# Patient Record
Sex: Female | Born: 2000 | Hispanic: Yes | Smoking: Never smoker
Health system: Southern US, Community
[De-identification: ages and names within clinical notes are randomized; demographics above are authoritative.]

## PROBLEM LIST (undated history)

## (undated) ENCOUNTER — Emergency Department: Admission: EM | Payer: Self-pay | Source: Home / Self Care

## (undated) DIAGNOSIS — F32A Depression, unspecified: Secondary | ICD-10-CM

## (undated) DIAGNOSIS — G43909 Migraine, unspecified, not intractable, without status migrainosus: Secondary | ICD-10-CM

## (undated) DIAGNOSIS — F419 Anxiety disorder, unspecified: Secondary | ICD-10-CM

## (undated) HISTORY — DX: Depression, unspecified: F32.A

## (undated) HISTORY — DX: Migraine, unspecified, not intractable, without status migrainosus: G43.909

## (undated) HISTORY — DX: Anxiety disorder, unspecified: F41.9

---
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
## 2022-02-20 DIAGNOSIS — Y999 Unspecified external cause status: Secondary | ICD-10-CM | POA: Diagnosis not present

## 2022-02-20 DIAGNOSIS — R52 Pain, unspecified: Secondary | ICD-10-CM | POA: Diagnosis not present

## 2022-02-20 DIAGNOSIS — T148XXA Other injury of unspecified body region, initial encounter: Secondary | ICD-10-CM | POA: Diagnosis not present

## 2022-02-20 DIAGNOSIS — M542 Cervicalgia: Secondary | ICD-10-CM | POA: Diagnosis not present

## 2022-02-20 DIAGNOSIS — M545 Low back pain, unspecified: Secondary | ICD-10-CM | POA: Diagnosis not present

## 2022-02-20 DIAGNOSIS — M25561 Pain in right knee: Secondary | ICD-10-CM | POA: Diagnosis not present

## 2022-02-26 ENCOUNTER — Encounter: Payer: Self-pay | Admitting: Physician Assistant

## 2022-02-26 ENCOUNTER — Ambulatory Visit (INDEPENDENT_AMBULATORY_CARE_PROVIDER_SITE_OTHER): Payer: BC Managed Care – PPO

## 2022-02-26 ENCOUNTER — Ambulatory Visit (INDEPENDENT_AMBULATORY_CARE_PROVIDER_SITE_OTHER): Payer: Self-pay | Admitting: Physician Assistant

## 2022-02-26 DIAGNOSIS — M25511 Pain in right shoulder: Secondary | ICD-10-CM

## 2022-02-26 DIAGNOSIS — M898X1 Other specified disorders of bone, shoulder: Secondary | ICD-10-CM | POA: Diagnosis not present

## 2022-02-26 DIAGNOSIS — M79671 Pain in right foot: Secondary | ICD-10-CM | POA: Diagnosis not present

## 2022-02-26 MED ORDER — KETOROLAC TROMETHAMINE 60 MG/2ML IM SOLN
60.0000 mg | Freq: Once | INTRAMUSCULAR | Status: AC
  Administered 2022-02-26: 60 mg via INTRAMUSCULAR

## 2022-02-26 MED ORDER — DICLOFENAC SODIUM 75 MG PO TBEC: 75.0000 mg | DELAYED_RELEASE_TABLET | Freq: Three times a day (TID) | ORAL | 0 refills | Status: DC

## 2022-02-26 NOTE — Patient Instructions (Signed)
Proximal Biceps Tendinitis and Tenosynovitis Rehab Ask your health care provider which exercises are safe for you. Do exercises exactly as told by your health care provider and adjust them as directed. It is normal to feel mild stretching, pulling, tightness, or discomfort as you do these exercises. Stop right away if you feel sudden pain or your pain gets worse. Do not begin these exercises until told by your health care provider. Stretching and range-of-motion exercises These exercises warm up your muscles and joints and improve the movement and flexibility of your arm and shoulder. The exercises also help to relieve pain and stiffness. Forearm rotation Stand or sit with your left / right elbow bent in a 90-degree angle (right angle). Position your forearm so that the thumb is facing the ceiling (neutral position). Rotate your palm up until it cannot go any farther. Hold this position for __________ seconds. Rotate your palm down until it cannot go any farther. Hold this position for __________ seconds. Repeat __________ times. Complete this exercise __________ times a day. Elbow range of motion Stand or sit with your left / right elbow bent in a 90-degree angle (right angle). Position your forearm so that the thumb is facing the ceiling (neutral position). Slowly bend your elbow as far as you can until you feel a stretch or cannot go any farther. Hold this position for __________ seconds. Slowly straighten your elbow as far as you can until you feel a stretch or cannot go any farther. Hold this position for __________ seconds. Repeat __________ times. Complete this exercise __________ times a day. Biceps stretch Stand facing a wall, or stand by a door frame. Raise your left / right arm out to your side, to your shoulder height. Place the thumb side of your hand against the wall. Your palm should be facing the floor (palm down). Keeping your arm straight, rotate your body in the opposite  direction of the raised arm until you feel a gentle stretch in your biceps. Hold this position for __________ seconds. Slowly return to the starting position. Repeat __________ times. Complete this exercise __________ times a day. Shoulder pendulum  Stand near a table or counter that you can hold onto for balance. Bend forward at the waist and let your left / right arm hang straight down. Use your other arm to support you and help you stay balanced. Relax your left / right arm and shoulder muscles, and move your hips and your trunk so your left / right arm swings freely. Your arm should swing because of the motion of your body, not because you are using your arm or shoulder muscles. Keep moving your hips and trunk so your arm swings in the following directions, as told by your health care provider: Side to side. Forward and backward. In clockwise and counterclockwise circles. Repeat __________ times. Complete this exercise __________ times a day. Shoulder flexion, assisted  Stand facing a wall. Put your left / right palm on the wall. Slowly move your left / right hand up the wall (flexion). Stop when you feel a stretch in your shoulder, or when you reach the angle that is recommended by your health care provider. Use your other hand to help raise your arm, if needed (assisted). As your hand gets higher, you may need to step closer to the wall. Avoid shrugging or lifting your shoulder up as you raise your arm. To do this, keep your shoulder blade tucked down toward your spine. Hold this position for __________ seconds.   Slowly return to the starting position. Use your other arm to help, if needed. Repeat __________ times. Complete this exercise __________ times a day. Shoulder flexion Stand with your left / right arm hanging down at your side. Keep your arm straight as you lift your arm forward and toward the ceiling (flexion). Hold this position for __________ seconds. Slowly return to the  starting position. Repeat __________ times. Complete this exercise __________ times a day. Sleeper stretch, assisted Lie on your left / right side (injured side) with your hips and knees bent and your left / right arm straight in front of you. Bend your elbow to a 90-degree angle (right angle), so your fingers are pointing to the ceiling. Use your other hand to gently push your arm toward the floor (assisted), stopping when you feel a gentle stretch. Keep your shoulder blades lightly squeezed together during the exercise. Hold this position for __________ seconds. Slowly return to the starting position. Repeat __________ times. Complete this exercise __________ times a day. Strengthening exercises These exercises build strength and endurance in your arm and shoulder. Endurance is the ability to use your muscles for a long time, even after they get tired. Biceps curls You can use a weight or an exercise band for this exercise. Sit on a stable chair without armrests, or stand up. Hold a __________ lb / kg weight in your left / right hand, or hold an exercise band with both hands. Your palms should face up toward the ceiling at the starting position. Bend your left / right elbow and move your hand up toward your shoulder. Keep your other arm straight down, in the starting position. Hold this position for __________ seconds. Slowly return to the starting position. Repeat __________ times. Complete this exercise __________ times a day. Internal shoulder rotation You will use an exercise band secured to a stable object at waist height for this exercise. A door and doorframe work well. Stand sideways next to a door with your left / right arm closest to the door, holding the exercise band in your hand. With your elbow bent in a 90-degree angle (right angle) and keeping your elbow at your side, bring your hand toward your belly (internal rotation). Make sure your wrist is staying straight as you do  this exercise. Hold this position for __________ seconds. Slowly return to the starting position. Repeat __________ times. Complete this exercise __________ times a day. External shoulder rotation You will use an exercise band secured to a stable object at waist height for this exercise. A door and doorframe work well. Stand sideways next to a door with your left / right arm away from the door, holding the exercise band in your hand. With your elbow bent in a 90-degree angle (right angle) and keeping your elbow at your side, swing your arm away from your body (external rotation). Make sure your wrist is staying straight as you do this exercise. Hold this position for __________ seconds. Slowly return to the starting position. Repeat __________ times. Complete this exercise __________ times a day. External shoulder rotation, side-lying You will use a weight to do this exercise. Lie on your uninjured side with your left / right arm at your side. Bend your elbow to a 90-degree angle (right angle). Hold a __________ lb / kg weight in your left / right hand. Keeping your elbow at your side, raise your arm toward the ceiling (external rotation). Make sure your wrist is staying straight as you do this exercise.   Hold this position for __________ seconds. Slowly return to the starting position. Repeat __________ times. Complete this exercise __________ times a day. Scapular retraction Scapular retraction is the process of pulling the shoulder blades (scapulae) toward each other, and toward the spine. You will need an exercise band to do this exercise. Sit in a stable chair without armrests, or stand up. Secure an exercise band to a stable object in front of you so the band is at shoulder height. Hold one end of the exercise band in each hand. Squeeze your shoulder blades together and move your elbows slightly behind you (retraction). Do not shrug your shoulders upward while you do this. Hold this  position for __________ seconds. Slowly return to the starting position. Repeat __________ times. Complete this exercise __________ times a day. Scapular protraction, supine Scapular protraction is the process of moving your shoulder blades away from each other, and away from the spine, while you lie on your back (supine position). Lie on your back on a firm surface. Hold a __________ lb / kg weight in your left / right hand. Raise your left / right arm straight into the air so your hand is directly above your shoulder joint. Push the weight into the air so your shoulder (scapula) lifts off the surface that you are lying on. Think of trying to punch the ceiling by only moving your scapula forward (protraction). Do not move your head, neck, or back. Hold this position for __________ seconds. Slowly return to the starting position. Repeat __________ times. Complete this exercise __________ times a day. This information is not intended to replace advice given to you by your health care provider. Make sure you discuss any questions you have with your health care provider. Document Revised: 08/22/2020 Document Reviewed: 08/22/2020 Elsevier Patient Education  2023 [employer]  
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
## 2022-04-01 ENCOUNTER — Other Ambulatory Visit: Payer: Self-pay | Admitting: Physician Assistant

## 2022-04-01 DIAGNOSIS — M25511 Pain in right shoulder: Secondary | ICD-10-CM

## 2022-04-01 DIAGNOSIS — M79671 Pain in right foot: Secondary | ICD-10-CM

## 2022-04-01 DIAGNOSIS — M898X1 Other specified disorders of bone, shoulder: Secondary | ICD-10-CM

## 2022-05-28 ENCOUNTER — Other Ambulatory Visit: Payer: Self-pay | Admitting: Physician Assistant

## 2022-05-28 ENCOUNTER — Ambulatory Visit (INDEPENDENT_AMBULATORY_CARE_PROVIDER_SITE_OTHER): Payer: BC Managed Care – PPO | Admitting: Physician Assistant

## 2022-05-28 ENCOUNTER — Encounter: Payer: Self-pay | Admitting: Physician Assistant

## 2022-05-28 VITALS — BP 135/72 | HR 88 | Ht 65.0 in | Wt 190.0 lb

## 2022-05-28 DIAGNOSIS — Z0000 Encounter for general adult medical examination without abnormal findings: Secondary | ICD-10-CM

## 2022-05-28 DIAGNOSIS — F419 Anxiety disorder, unspecified: Secondary | ICD-10-CM | POA: Diagnosis not present

## 2022-05-28 DIAGNOSIS — Z20818 Contact with and (suspected) exposure to other bacterial communicable diseases: Secondary | ICD-10-CM

## 2022-05-28 DIAGNOSIS — M25511 Pain in right shoulder: Secondary | ICD-10-CM

## 2022-05-28 DIAGNOSIS — J029 Acute pharyngitis, unspecified: Secondary | ICD-10-CM

## 2022-05-28 DIAGNOSIS — E6609 Other obesity due to excess calories: Secondary | ICD-10-CM

## 2022-05-28 DIAGNOSIS — G8929 Other chronic pain: Secondary | ICD-10-CM

## 2022-05-28 DIAGNOSIS — F900 Attention-deficit hyperactivity disorder, predominantly inattentive type: Secondary | ICD-10-CM | POA: Diagnosis not present

## 2022-05-28 DIAGNOSIS — Z6831 Body mass index (BMI) 31.0-31.9, adult: Secondary | ICD-10-CM

## 2022-05-28 DIAGNOSIS — M25561 Pain in right knee: Secondary | ICD-10-CM

## 2022-05-28 DIAGNOSIS — F32A Depression, unspecified: Secondary | ICD-10-CM

## 2022-05-28 MED ORDER — LISDEXAMFETAMINE DIMESYLATE 40 MG PO CAPS: 40.0000 mg | ORAL_CAPSULE | ORAL | 0 refills | Status: DC

## 2022-05-28 MED ORDER — DESVENLAFAXINE SUCCINATE ER 100 MG PO TB24
100.0000 mg | ORAL_TABLET | Freq: Every day | ORAL | 1 refills | Status: DC
Start: 2022-05-28 — End: 2022-09-04

## 2022-05-28 MED ORDER — LISDEXAMFETAMINE DIMESYLATE 40 MG PO CAPS
40.0000 mg | ORAL_CAPSULE | ORAL | 0 refills | Status: DC
Start: 2022-06-26 — End: 2022-09-10

## 2022-05-28 MED ORDER — CEFDINIR 300 MG PO CAPS: 300.0000 mg | ORAL_CAPSULE | Freq: Two times a day (BID) | ORAL | 0 refills | Status: DC

## 2022-05-28 NOTE — Patient Instructions (Addendum)
Calm aid lavender tablets Get labs Make appt with Dr. Karie Schwalbe  Health Maintenance, Female Adopting a healthy lifestyle and getting preventive care are important in promoting health and wellness. Ask your health care provider about: The right schedule for you to have regular tests and exams. Things you can do on your own to prevent diseases and keep yourself healthy. What should I know about diet, weight, and exercise? Eat a healthy diet  Eat a diet that includes plenty of vegetables, fruits, low-fat dairy products, and lean protein. Do not eat a lot of foods that are high in solid fats, added sugars, or sodium. Maintain a healthy weight Body mass index (BMI) is used to identify weight problems. It estimates body fat based on height and weight. Your health care provider can help determine your BMI and help you achieve or maintain a healthy weight. Get regular exercise Get regular exercise. This is one of the most important things you can do for your health. Most adults should: Exercise for at least 150 minutes each week. The exercise should increase your heart rate and make you sweat (moderate-intensity exercise). Do strengthening exercises at least twice a week. This is in addition to the moderate-intensity exercise. Spend less time sitting. Even light physical activity can be beneficial. Watch cholesterol and blood lipids Have your blood tested for lipids and cholesterol at 19 years of age, then have this test every 5 years. Have your cholesterol levels checked more often if: Your lipid or cholesterol levels are high. You are older than 19 years of age. You are at high risk for heart disease. What should I know about cancer screening? Depending on your health history and family history, you may need to have cancer screening at various ages. This may include screening for: Breast cancer. Cervical cancer. Colorectal cancer. Skin cancer. Lung cancer. What should I know about heart disease,  diabetes, and high blood pressure? Blood pressure and heart disease High blood pressure causes heart disease and increases the risk of stroke. This is more likely to develop in people who have high blood pressure readings or are overweight. Have your blood pressure checked: Every 3-5 years if you are 29-30 years of age. Every year if you are 57 years old or older. Diabetes Have regular diabetes screenings. This checks your fasting blood sugar level. Have the screening done: Once every three years after age 60 if you are at a normal weight and have a low risk for diabetes. More often and at a younger age if you are overweight or have a high risk for diabetes. What should I know about preventing infection? Hepatitis B If you have a higher risk for hepatitis B, you should be screened for this virus. Talk with your health care provider to find out if you are at risk for hepatitis B infection. Hepatitis C Testing is recommended for: Everyone born from [birth year] through 1963. Anyone with known risk factors for hepatitis C. Sexually transmitted infections (STIs) Get screened for STIs, including gonorrhea and chlamydia, if: You are sexually active and are younger than 19 years of age. You are older than 19 years of age and your health care provider tells you that you are at risk for this type of infection. Your sexual activity has changed since you were last screened, and you are at increased risk for chlamydia or gonorrhea. Ask your health care provider if you are at risk. Ask your health care provider about whether you are at high risk for HIV. Your  health care provider may recommend a prescription medicine to help prevent HIV infection. If you choose to take medicine to prevent HIV, you should first get tested for HIV. You should then be tested every 3 months for as long as you are taking the medicine. Pregnancy If you are about to stop having your period (premenopausal) and you may become pregnant,  seek counseling before you get pregnant. Take 400 to 800 micrograms (mcg) of folic acid every day if you become pregnant. Ask for birth control (contraception) if you want to prevent pregnancy. Osteoporosis and menopause Osteoporosis is a disease in which the bones lose minerals and strength with aging. This can result in bone fractures. If you are 69 years old or older, or if you are at risk for osteoporosis and fractures, ask your health care provider if you should: Be screened for bone loss. Take a calcium or vitamin D supplement to lower your risk of fractures. Be given hormone replacement therapy (HRT) to treat symptoms of menopause. Follow these instructions at home: Alcohol use Do not drink alcohol if: Your health care provider tells you not to drink. You are pregnant, may be pregnant, or are planning to become pregnant. If you drink alcohol: Limit how much you have to: 0-1 drink a day. Know how much alcohol is in your drink. In the [place] one drink equals one 12 oz bottle of beer (355 mL), one 5 oz glass of wine (148 mL), or one 1 oz glass of hard liquor (44 mL). Lifestyle Do not use any products that contain nicotine or tobacco. These products include cigarettes, chewing tobacco, and vaping devices, such as e-cigarettes. If you need help quitting, ask your health care provider. Do not use street drugs. Do not share needles. Ask your health care provider for help if you need support or information about quitting drugs. General instructions Schedule regular health, dental, and eye exams. Stay current with your vaccines. Tell your health care provider if: You often feel depressed. You have ever been abused or do not feel safe at home. Summary Adopting a healthy lifestyle and getting preventive care are important in promoting health and wellness. Follow your health care provider's instructions about healthy diet, exercising, and getting tested or screened for diseases. Follow your  health care provider's instructions on monitoring your cholesterol and blood pressure. This information is not intended to replace advice given to you by your health care provider. Make sure you discuss any questions you have with your health care provider. Document Revised: 11/19/2020 Document Reviewed: 11/19/2020 Elsevier Patient Education  2023 [employer].
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
## 2022-05-30 DIAGNOSIS — G8929 Other chronic pain: Secondary | ICD-10-CM | POA: Insufficient documentation

## 2022-05-30 DIAGNOSIS — E6609 Other obesity due to excess calories: Secondary | ICD-10-CM | POA: Insufficient documentation

## 2022-06-02 DIAGNOSIS — Z0000 Encounter for general adult medical examination without abnormal findings: Secondary | ICD-10-CM | POA: Diagnosis not present

## 2022-06-03 LAB — COMPLETE METABOLIC PANEL WITH GFR
AG Ratio: 1.7 (calc) (ref 1.0–2.5)
ALT: 8 U/L (ref 5–32)
AST: 15 U/L (ref 12–32)
Albumin: 4.7 g/dL (ref 3.6–5.1)
Alkaline phosphatase (APISO): 50 U/L (ref 36–128)
BUN: 10 mg/dL (ref 7–20)
CO2: 29 mmol/L (ref 20–32)
Calcium: 9.7 mg/dL (ref 8.9–10.4)
Chloride: 105 mmol/L (ref 98–110)
Creat: 0.66 mg/dL (ref 0.50–0.96)
Globulin: 2.7 g/dL (calc) (ref 2.0–3.8)
Glucose, Bld: 80 mg/dL (ref 65–99)
Potassium: 5.1 mmol/L (ref 3.8–5.1)
Sodium: 140 mmol/L (ref 135–146)
Total Bilirubin: 0.6 mg/dL (ref 0.2–1.1)
Total Protein: 7.4 g/dL (ref 6.3–8.2)
eGFR: 130 mL/min/{1.73_m2} (ref >=60)

## 2022-06-03 LAB — CBC WITH DIFFERENTIAL/PLATELET
Absolute Monocytes: 289 cells/uL (ref 200–950)
Basophils Absolute: 47 cells/uL (ref 0–200)
Basophils Relative: 0.8 %
Eosinophils Absolute: 71 cells/uL (ref 15–500)
Eosinophils Relative: 1.2 %
HCT: 37 % (ref 35.0–45.0)
Hemoglobin: 12.4 g/dL (ref 11.7–15.5)
Lymphs Abs: 2590 cells/uL (ref 850–3900)
MCH: 30 pg (ref 27.0–33.0)
MCHC: 33.5 g/dL (ref 32.0–36.0)
MCV: 89.4 fL (ref 80.0–100.0)
MPV: 10.6 fL (ref 7.5–12.5)
Monocytes Relative: 4.9 %
Neutro Abs: 2903 cells/uL (ref 1500–7800)
Neutrophils Relative %: 49.2 %
Platelets: 307 10*3/uL (ref 140–400)
RBC: 4.14 10*6/uL (ref 3.80–5.10)
RDW: 12.3 % (ref 11.0–15.0)
Total Lymphocyte: 43.9 %
WBC: 5.9 10*3/uL (ref 3.8–10.8)

## 2022-06-03 LAB — TSH: TSH: 1.49 mIU/L

## 2022-06-03 LAB — LIPID PANEL W/REFLEX DIRECT LDL
Cholesterol: 147 mg/dL (ref <170)
HDL: 68 mg/dL (ref >45)
LDL Cholesterol (Calc): 66 mg/dL (calc) (ref <110)
Non-HDL Cholesterol (Calc): 79 mg/dL (calc) (ref <120)
Total CHOL/HDL Ratio: 2.2 (calc) (ref <5.0)
Triglycerides: 46 mg/dL (ref <90)

## 2022-06-03 NOTE — Progress Notes (Signed)
Carly Cooper,   Cholesterol looks good.  Thyroid looks great.  Kidney, liver, glucose look good.  Normal hemoglobin.

## 2022-06-04 ENCOUNTER — Ambulatory Visit: Payer: BC Managed Care – PPO | Admitting: Sports Medicine

## 2022-06-04 DIAGNOSIS — M25561 Pain in right knee: Secondary | ICD-10-CM

## 2022-06-04 DIAGNOSIS — G8929 Other chronic pain: Secondary | ICD-10-CM

## 2022-06-04 DIAGNOSIS — M25511 Pain in right shoulder: Secondary | ICD-10-CM

## 2022-06-04 MED ORDER — MELOXICAM 15 MG PO TABS: ORAL_TABLET | ORAL | 3 refills | Status: DC

## 2022-06-04 NOTE — Progress Notes (Signed)
    Procedures performed today:    None.  Independent interpretation of notes and tests performed by another provider:   None.  Brief History, Exam, Impression, and Recommendations:    Chronic right shoulder pain This is a very pleasant 21 year old female, she has had chronic right-sided shoulder pain localized over the deltoid as well as anteriorly over the clavicle and sternoclavicular joint for approximately 3-1/2 months now.  She has had x-rays that were unrevealing, she has done physician directed conservative treatment, and has not improved. Today I think it is reasonable to go and proceed with MRI with the field taken medially to the sternoclavicular joint. I am also going to do some additional therapy in the form of formal PT and add meloxicam, return to see me in about 6 weeks.  Chronic pain of right knee Also has had several months of pain at right knee anterior aspect, on exam she has positive painful patellar compression, other tests are normal with the exception of very weak hip abductor's, and the aggressive therapy, as she has had pain for many months we will add x-rays and an MRI. Return to see me in about 6 weeks.  We did discuss the anatomy, pathophysiology of patellofemoral syndrome and rotator cuff dysfunction syndrome    ____________________________________________ Ihor Austin. Benjamin Stain, M.D., ABFM., CAQSM., AME. Primary Care and Sports Medicine [facility]  Adjunct Professor of Family Medicine  [employer]  Restaurant manager, fast food

## 2022-06-04 NOTE — Assessment & Plan Note (Signed)
This is a very pleasant 21 year old female, she has had chronic right-sided shoulder pain localized over the deltoid as well as anteriorly over the clavicle and sternoclavicular joint for approximately 3-1/2 months now.  She has had x-rays that were unrevealing, she has done physician directed conservative treatment, and has not improved. Today I think it is reasonable to go and proceed with MRI with the field taken medially to the sternoclavicular joint. I am also going to do some additional therapy in the form of formal PT and add meloxicam, return to see me in about 6 weeks.

## 2022-06-04 NOTE — Assessment & Plan Note (Signed)
Also has had several months of pain at right knee anterior aspect, on exam she has positive painful patellar compression, other tests are normal with the exception of very weak hip abductor's, and the aggressive therapy, as she has had pain for many months we will add x-rays and an MRI. Return to see me in about 6 weeks.  We did discuss the anatomy, pathophysiology of patellofemoral syndrome and rotator cuff dysfunction syndrome

## 2022-06-09 ENCOUNTER — Ambulatory Visit (INDEPENDENT_AMBULATORY_CARE_PROVIDER_SITE_OTHER): Payer: BC Managed Care – PPO

## 2022-06-09 DIAGNOSIS — G8929 Other chronic pain: Secondary | ICD-10-CM | POA: Diagnosis not present

## 2022-06-09 DIAGNOSIS — Z09 Encounter for follow-up examination after completed treatment for conditions other than malignant neoplasm: Secondary | ICD-10-CM

## 2022-06-09 DIAGNOSIS — M25561 Pain in right knee: Secondary | ICD-10-CM | POA: Diagnosis not present

## 2022-06-09 DIAGNOSIS — M25562 Pain in left knee: Secondary | ICD-10-CM | POA: Diagnosis not present

## 2022-06-11 ENCOUNTER — Other Ambulatory Visit: Payer: Self-pay | Admitting: Neurology

## 2022-06-11 DIAGNOSIS — F900 Attention-deficit hyperactivity disorder, predominantly inattentive type: Secondary | ICD-10-CM

## 2022-06-11 MED ORDER — LISDEXAMFETAMINE DIMESYLATE 40 MG PO CAPS: 40.0000 mg | ORAL_CAPSULE | ORAL | 0 refills | Status: DC

## 2022-06-11 NOTE — Telephone Encounter (Signed)
Meds ordered this encounter  °Medications  ° lisdexamfetamine (VYVANSE) 40 MG capsule  °  Sig: Take 1 capsule (40 mg total) by mouth every morning.  °  Dispense:  30 capsule  °  Refill:  0  ° ° °

## 2022-06-11 NOTE — Telephone Encounter (Signed)
Patient called and LVM stating her pharmacy is out of stock of Vyvanse. Available at [facility] and wants [place] to send here. Pended. Sign is appropriate.

## 2022-06-17 ENCOUNTER — Encounter: Payer: Self-pay | Admitting: Family Medicine

## 2022-06-17 ENCOUNTER — Ambulatory Visit: Payer: BC Managed Care – PPO | Admitting: Family Medicine

## 2022-06-17 VITALS — BP 131/84 | HR 89 | Temp 99.2°F | Ht 65.0 in | Wt 190.0 lb

## 2022-06-17 DIAGNOSIS — H9203 Otalgia, bilateral: Secondary | ICD-10-CM

## 2022-06-17 DIAGNOSIS — R509 Fever, unspecified: Secondary | ICD-10-CM

## 2022-06-17 DIAGNOSIS — B9789 Other viral agents as the cause of diseases classified elsewhere: Secondary | ICD-10-CM

## 2022-06-17 DIAGNOSIS — Z20822 Contact with and (suspected) exposure to covid-19: Secondary | ICD-10-CM

## 2022-06-17 DIAGNOSIS — J988 Other specified respiratory disorders: Secondary | ICD-10-CM

## 2022-06-17 DIAGNOSIS — J029 Acute pharyngitis, unspecified: Secondary | ICD-10-CM

## 2022-06-17 LAB — POCT INFLUENZA A/B
Influenza A, POC: NEGATIVE
Influenza B, POC: NEGATIVE

## 2022-06-17 LAB — POCT RAPID STREP A (OFFICE): Rapid Strep A Screen: NEGATIVE

## 2022-06-17 LAB — POC COVID19 BINAXNOW: SARS Coronavirus 2 Ag: NEGATIVE

## 2022-06-17 NOTE — Assessment & Plan Note (Signed)
Point-of-care testing for flu, COVID and strep are negative today.  Recommend the continued supportive care with increase fluids, warm salt water gargles, saline nasal rinses and over-the-counter analgesics as needed.  Contact clinic if having new or worsening symptoms.

## 2022-06-17 NOTE — Progress Notes (Signed)
Carly Cooper - 21 y.o. female MRN [id number]  Date of birth: [birth year]  Subjective Chief Complaint  Patient presents with   Ear Pain   Sore Throat   URI         HPI Carly Cooper is a 21 year old female here today with complaint of fatigue, congestion, sore throat, ear pain, headache, fever and cough.  She has had symptoms for a few days.  She reports exposure to strep about 2 weeks ago and RSV last week.  She has tried over-the-counter medications with some improvement.  She denies any dyspnea, chest pain or wheezing.  Denies GI symptoms.  ROS:  A comprehensive ROS was completed and negative except as noted per HPI  Allergies  Allergen Reactions   Penicillins Rash   Amoxicillin     Past Medical History:  Diagnosis Date   Anxiety    Depression    Migraine     Past Surgical History:  Procedure Laterality Date   TONSILLECTOMY  2015   WISDOM TOOTH EXTRACTION  2020    Social History   Socioeconomic History   Marital status: Single    Spouse name: Not on file   Number of children: Not on file   Years of education: Not on file   Highest education level: Not on file  Occupational History   Not on file  Tobacco Use   Smoking status: Never   Smokeless tobacco: Never  Vaping Use   Vaping Use: Never used  Substance and Sexual Activity   Alcohol use: Never   Drug use: Never   Sexual activity: Never  Other Topics Concern   Not on file  Social History Narrative   Not on file   Social Determinants of Health   Financial Resource Strain: Not on file  Food Insecurity: Not on file  Transportation Needs: Not on file  Physical Activity: Not on file  Stress: Not on file  Social Connections: Not on file    History reviewed. No pertinent family history.  Health Maintenance  Topic Date Due   COVID-19 Vaccine (4 - 2023-24 season) 08/14/2022 (Originally 03/14/2022)   INFLUENZA VACCINE  10/12/2022 (Originally 02/11/2022)   Hepatitis C Screening  02/22/2023 (Originally  12/29/2020)   HIV Screening  05/31/2023 (Originally 12/29/2017)   DTaP/Tdap/Td (7 - Td or Tdap) 01/17/2024   HPV VACCINES  Completed     ----------------------------------------------------------------------------------------------------------------------------------------------------------------------------------------------------------------- Physical Exam BP 131/84 (BP Location: Left Arm, Patient Position: Sitting, Cuff Size: Normal)   Pulse 89   Temp 99.2 F (37.3 C) (Oral)   Ht 5\' 5"  (1.651 m)   Wt 190 lb (86.2 kg)   SpO2 100%   BMI 31.62 kg/m   Physical Exam Constitutional:      Appearance: She is well-developed.  HENT:     Head: Normocephalic and atraumatic.     Right Ear: Tympanic membrane normal.     Left Ear: Tympanic membrane normal.  Eyes:     General: No scleral icterus. Cardiovascular:     Rate and Rhythm: Normal rate and regular rhythm.  Pulmonary:     Effort: Pulmonary effort is normal.     Breath sounds: Normal breath sounds.  Musculoskeletal:     Cervical back: Neck supple.  Neurological:     Mental Status: She is alert.  Psychiatric:        Mood and Affect: Mood normal.        Behavior: Behavior normal.     ------------------------------------------------------------------------------------------------------------------------------------------------------------------------------------------------------------------- Assessment and Plan  Viral respiratory illness Point-of-care testing  for flu, COVID and strep are negative today.  Recommend the continued supportive care with increase fluids, warm salt water gargles, saline nasal rinses and over-the-counter analgesics as needed.  Contact clinic if having new or worsening symptoms.   No orders of the defined types were placed in this encounter.   No follow-ups on file.    This visit occurred during the SARS-CoV-2 public health emergency.  Safety protocols were in place, including screening  questions prior to the visit, additional usage of staff PPE, and extensive cleaning of exam room while observing appropriate contact time as indicated for disinfecting solutions.

## 2022-06-17 NOTE — Patient Instructions (Signed)

## 2022-06-18 ENCOUNTER — Ambulatory Visit: Payer: BC Managed Care – PPO | Admitting: Rehabilitative and Restorative Service Providers"

## 2022-06-18 NOTE — Therapy (Deleted)
OUTPATIENT PHYSICAL THERAPY SHOULDER EVALUATION   Patient Name: Carly Cooper MRN: [id number] DOB:01-13-2001, 21 y.o., female Today's Date: 06/18/2022  END OF SESSION:   Past Medical History:  Diagnosis Date   Anxiety    Depression    Migraine    Past Surgical History:  Procedure Laterality Date   TONSILLECTOMY  2015   WISDOM TOOTH EXTRACTION  2020   Patient Active Problem List   Diagnosis Date Noted   Viral respiratory illness 06/17/2022   Chronic right shoulder pain 05/30/2022   Chronic pain of right knee 05/30/2022   Class 1 obesity due to excess calories without serious comorbidity with body mass index (BMI) of 31.0 to 31.9 in adult 05/30/2022   Pain of right clavicle 02/26/2022   Right foot pain 02/26/2022   Trouble in sleeping 12/24/2021   Acute pain of right shoulder 09/17/2021   Poor concentration 05/20/2021   Inattention 05/20/2021   Family history of attention deficit disorder 05/20/2021   Attention deficit hyperactivity disorder (ADHD), predominantly inattentive type 05/20/2021   Migraine without aura and without status migrainosus, not intractable 07/03/2020   Anxiety and depression 07/03/2020   No energy 07/03/2020   Class 2 obesity due to excess calories without serious comorbidity with body mass index (BMI) of 37.0 to 37.9 in adult 07/03/2020   Eczema 07/03/2020   Keratosis pilaris 07/03/2020   Relationship problem with parent 07/03/2020   Iron deficiency anemia secondary to inadequate dietary iron intake 07/03/2020   Vitamin D insufficiency 07/03/2020    PCP: Tandy Gaw, PA-C  REFERRING PROVIDER: Dr Rodney Langton  REFERRING DIAG: Chronic Rt shoulder pain; Chronic Rt knee pain   THERAPY DIAG:  No diagnosis found.  Rationale for Evaluation and Treatment: Rehabilitation  ONSET DATE: ***  SUBJECTIVE:                                                                                                                                                                                       SUBJECTIVE STATEMENT: ***  PERTINENT HISTORY: ***  PAIN:  Are you having pain?  Yes:  NPRS scale: ***/10", Pain location: Pain description:  Aggravating factors:  Relieving factors:   PRECAUTIONS: None  WEIGHT BEARING RESTRICTIONS: No  FALLS:  Has patient fallen in last 6 months? No  LIVING ENVIRONMENT: Lives with: {OPRC lives with:25569::"lives with their family"} Lives in: {Lives in:25570} Stairs: {opstairs:27293} Has following equipment at home: {Assistive devices:23999}  OCCUPATION: ***  PLOF: Independent  PATIENT GOALS:***  NEXT MD VISIT:   OBJECTIVE:   DIAGNOSTIC FINDINGS:  MRI scheduled 06/22/22 Xray - 8/23 -Clavicle/shoulder - There is no evidence of fracture or dislocation. There is no evidence  of arthropathy or other focal bone abnormality. Soft tissues are unremarkable.  PATIENT SURVEYS:  FOTO ***  COGNITION: Overall cognitive status: Within functional limits for tasks assessed     SENSATION: {sensation:27233}  POSTURE: ***  UPPER EXTREMITY ROM:   Active ROM Right eval Left eval  Shoulder flexion    Shoulder extension    Shoulder abduction    Shoulder adduction    Shoulder internal rotation    Shoulder external rotation    Elbow flexion    Elbow extension    Wrist flexion    Wrist extension    Wrist ulnar deviation    Wrist radial deviation    Wrist pronation    Wrist supination    (Blank rows = not tested)  UPPER EXTREMITY MMT:  MMT Right eval Left eval  Shoulder flexion    Shoulder extension    Shoulder abduction    Shoulder adduction    Shoulder internal rotation    Shoulder external rotation    Middle trapezius    Lower trapezius    Elbow flexion    Elbow extension    Wrist flexion    Wrist extension    Wrist ulnar deviation    Wrist radial deviation    Wrist pronation    Wrist supination    Grip strength (lbs)    (Blank rows = not tested)  SHOULDER SPECIAL  TESTS: Impingement tests: {shoulder impingement test:25231:a} SLAP lesions: {SLAP lesions:25232} Instability tests: {shoulder instability test:25233} Rotator cuff assessment: {rotator cuff assessment:25234} Biceps assessment: {biceps assessment:25235}  JOINT MOBILITY TESTING:  ***  PALPATION:  ***   TODAY'S TREATMENT:                                                                                                                                         DATE: 06/18/22   PATIENT EDUCATION: Education details: POC; HEP Person educated: Patient Education method: Programmer, multimedia, Facilities manager, Actor cues, Verbal cues, and Handouts Education comprehension: verbalized understanding, returned demonstration, verbal cues required, tactile cues required, and needs further education  HOME EXERCISE PROGRAM: ***  ASSESSMENT:  CLINICAL IMPRESSION: Patient is a 21 y.o. who was seen today for physical therapy evaluation and treatment for chronic Rt shoulder and knee pain.   OBJECTIVE IMPAIRMENTS: {opptimpairments:25111}.   ACTIVITY LIMITATIONS: {activitylimitations:27494}  PARTICIPATION LIMITATIONS: {participationrestrictions:25113}  PERSONAL FACTORS: {Personal factors:25162} are also affecting patient's functional outcome.   REHAB POTENTIAL: Good  CLINICAL DECISION MAKING: Stable/uncomplicated  EVALUATION COMPLEXITY: Low   GOALS: Goals reviewed with patient? Yes  SHORT TERM GOALS: Target date: 07/16/2022   Independent in initial HEP Baseline: Goal status: INITIAL  2.  *** Baseline:  Goal status: INITIAL    LONG TERM GOALS: Target date: 08/13/2022   *** Baseline:  Goal status: INITIAL  2.  *** Baseline:  Goal status: INITIAL  3.  *** Baseline:  Goal status: INITIAL  4.  *** Baseline:  Goal  status: INITIAL  5.  *** Baseline:  Goal status: INITIAL  6.  *** Baseline:  Goal status: INITIAL  PLAN:  PT FREQUENCY: 2x/week  PT DURATION: 8 weeks  PLANNED  INTERVENTIONS: Therapeutic exercises, Therapeutic activity, Neuromuscular re-education, Patient/Family education, Self Care, Joint mobilization, Aquatic Therapy, Dry Needling, Electrical stimulation, Cryotherapy, Moist heat, Taping, Vasopneumatic device, Ultrasound, Ionotophoresis 4mg /ml Dexamethasone, Manual therapy, and Re-evaluation  PLAN FOR NEXT SESSION: review an dprogress with postural correction and HEP; manual work and/or dry needling; modalities as indicated    Fransheska Willingham , PT, MPH 06/18/2022, 7:35 AM

## 2022-06-19 NOTE — Therapy (Deleted)
OUTPATIENT PHYSICAL THERAPY SHOULDER EVALUATION   Patient Name: Carly Cooper MRN: [id number] DOB:2001-04-28, 21 y.o., female Today's Date: 06/19/2022  END OF SESSION:   Past Medical History:  Diagnosis Date   Anxiety    Depression    Migraine    Past Surgical History:  Procedure Laterality Date   TONSILLECTOMY  2015   WISDOM TOOTH EXTRACTION  2020   Patient Active Problem List   Diagnosis Date Noted   Viral respiratory illness 06/17/2022   Chronic right shoulder pain 05/30/2022   Chronic pain of right knee 05/30/2022   Class 1 obesity due to excess calories without serious comorbidity with body mass index (BMI) of 31.0 to 31.9 in adult 05/30/2022   Pain of right clavicle 02/26/2022   Right foot pain 02/26/2022   Trouble in sleeping 12/24/2021   Acute pain of right shoulder 09/17/2021   Poor concentration 05/20/2021   Inattention 05/20/2021   Family history of attention deficit disorder 05/20/2021   Attention deficit hyperactivity disorder (ADHD), predominantly inattentive type 05/20/2021   Migraine without aura and without status migrainosus, not intractable 07/03/2020   Anxiety and depression 07/03/2020   No energy 07/03/2020   Class 2 obesity due to excess calories without serious comorbidity with body mass index (BMI) of 37.0 to 37.9 in adult 07/03/2020   Eczema 07/03/2020   Keratosis pilaris 07/03/2020   Relationship problem with parent 07/03/2020   Iron deficiency anemia secondary to inadequate dietary iron intake 07/03/2020   Vitamin D insufficiency 07/03/2020    PCP: Tandy Gaw, PA-C  REFERRING PROVIDER: Dr Rodney Langton  REFERRING DIAG: Chronic Rt shoulder pain; Chronic Rt knee pain   THERAPY DIAG:  No diagnosis found.  Rationale for Evaluation and Treatment: Rehabilitation  ONSET DATE: ***  SUBJECTIVE:                                                                                                                                                                                       SUBJECTIVE STATEMENT: ***  PERTINENT HISTORY: ***  PAIN:  Are you having pain?  Yes:  NPRS scale: ***/10", Pain location: Pain description:  Aggravating factors:  Relieving factors:   PRECAUTIONS: None  WEIGHT BEARING RESTRICTIONS: No  FALLS:  Has patient fallen in last 6 months? No  LIVING ENVIRONMENT: Lives with: {[facility] lives with:25569::"lives with their family"} Lives in: {Lives in:25570} Stairs: {opstairs:27293} Has following equipment at home: {Assistive devices:23999}  OCCUPATION: ***  PLOF: Independent  PATIENT GOALS:***  NEXT MD VISIT:   OBJECTIVE:   DIAGNOSTIC FINDINGS:  MRI scheduled 06/22/22 Xray - 8/23 -Clavicle/shoulder - There is no evidence of fracture or dislocation. There is no evidence  of arthropathy or other focal bone abnormality. Soft tissues are unremarkable.  PATIENT SURVEYS:  FOTO ***  COGNITION: Overall cognitive status: Within functional limits for tasks assessed     SENSATION: {sensation:27233}  POSTURE: ***  UPPER EXTREMITY ROM:   Active ROM Right eval Left eval  Shoulder flexion    Shoulder extension    Shoulder abduction    Shoulder adduction    Shoulder internal rotation    Shoulder external rotation    Elbow flexion    Elbow extension    Wrist flexion    Wrist extension    Wrist ulnar deviation    Wrist radial deviation    Wrist pronation    Wrist supination    (Blank rows = not tested)  UPPER EXTREMITY MMT:  MMT Right eval Left eval  Shoulder flexion    Shoulder extension    Shoulder abduction    Shoulder adduction    Shoulder internal rotation    Shoulder external rotation    Middle trapezius    Lower trapezius    Elbow flexion    Elbow extension    Wrist flexion    Wrist extension    Wrist ulnar deviation    Wrist radial deviation    Wrist pronation    Wrist supination    Grip strength (lbs)    (Blank rows = not tested)  SHOULDER SPECIAL  TESTS: Impingement tests: {shoulder impingement test:25231:a} SLAP lesions: {SLAP lesions:25232} Instability tests: {shoulder instability test:25233} Rotator cuff assessment: {rotator cuff assessment:25234} Biceps assessment: {biceps assessment:25235}  JOINT MOBILITY TESTING:  ***  PALPATION:  ***   TODAY'S TREATMENT:                                                                                                                                         DATE: 06/23/22   PATIENT EDUCATION: Education details: POC; HEP Person educated: Patient Education method: Programmer, multimedia, Facilities manager, Actor cues, Verbal cues, and Handouts Education comprehension: verbalized understanding, returned demonstration, verbal cues required, tactile cues required, and needs further education  HOME EXERCISE PROGRAM: ***  ASSESSMENT:  CLINICAL IMPRESSION: Patient is a 21 y.o. who was seen today for physical therapy evaluation and treatment for chronic Rt shoulder and knee pain.   OBJECTIVE IMPAIRMENTS: {opptimpairments:25111}.   ACTIVITY LIMITATIONS: {activitylimitations:27494}  PARTICIPATION LIMITATIONS: {participationrestrictions:25113}  PERSONAL FACTORS: {Personal factors:25162} are also affecting patient's functional outcome.   REHAB POTENTIAL: Good  CLINICAL DECISION MAKING: Stable/uncomplicated  EVALUATION COMPLEXITY: Low   GOALS: Goals reviewed with patient? Yes  SHORT TERM GOALS: Target date: 07/21/2022   Independent in initial HEP Baseline: Goal status: INITIAL  2.  *** Baseline:  Goal status: INITIAL    LONG TERM GOALS: Target date: 08/18/2022   *** Baseline:  Goal status: INITIAL  2.  *** Baseline:  Goal status: INITIAL  3.  *** Baseline:  Goal status: INITIAL  4.  *** Baseline:  Goal  status: INITIAL  5.  *** Baseline:  Goal status: INITIAL  6.  *** Baseline:  Goal status: INITIAL  PLAN:  PT FREQUENCY: 2x/week  PT DURATION: 8 weeks  PLANNED  INTERVENTIONS: Therapeutic exercises, Therapeutic activity, Neuromuscular re-education, Patient/Family education, Self Care, Joint mobilization, Aquatic Therapy, Dry Needling, Electrical stimulation, Cryotherapy, Moist heat, Taping, Vasopneumatic device, Ultrasound, Ionotophoresis 4mg /ml Dexamethasone, Manual therapy, and Re-evaluation  PLAN FOR NEXT SESSION: review an dprogress with postural correction and HEP; manual work and/or dry needling; modalities as indicated    Ryan Ogborn , PT, MPH 06/19/2022, 3:23 PM

## 2022-06-22 ENCOUNTER — Ambulatory Visit (INDEPENDENT_AMBULATORY_CARE_PROVIDER_SITE_OTHER): Payer: BC Managed Care – PPO

## 2022-06-22 DIAGNOSIS — G8929 Other chronic pain: Secondary | ICD-10-CM

## 2022-06-22 DIAGNOSIS — M25561 Pain in right knee: Secondary | ICD-10-CM | POA: Diagnosis not present

## 2022-06-22 DIAGNOSIS — M25511 Pain in right shoulder: Secondary | ICD-10-CM

## 2022-06-23 ENCOUNTER — Ambulatory Visit: Payer: BC Managed Care – PPO | Admitting: Rehabilitative and Restorative Service Providers"

## 2022-06-25 ENCOUNTER — Ambulatory Visit: Payer: BC Managed Care – PPO | Attending: Sports Medicine | Admitting: Rehabilitative and Restorative Service Providers"

## 2022-06-25 ENCOUNTER — Other Ambulatory Visit: Payer: Self-pay

## 2022-06-25 DIAGNOSIS — M542 Cervicalgia: Secondary | ICD-10-CM | POA: Insufficient documentation

## 2022-06-25 DIAGNOSIS — R293 Abnormal posture: Secondary | ICD-10-CM | POA: Diagnosis not present

## 2022-06-25 DIAGNOSIS — M6281 Muscle weakness (generalized): Secondary | ICD-10-CM | POA: Insufficient documentation

## 2022-06-25 DIAGNOSIS — G8929 Other chronic pain: Secondary | ICD-10-CM | POA: Diagnosis not present
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
## 2022-06-30 ENCOUNTER — Ambulatory Visit: Payer: BC Managed Care – PPO | Admitting: Rehabilitative and Restorative Service Providers"

## 2022-06-30 ENCOUNTER — Encounter: Payer: Self-pay | Admitting: Rehabilitative and Restorative Service Providers"

## 2022-06-30 DIAGNOSIS — G8929 Other chronic pain: Secondary | ICD-10-CM | POA: Diagnosis not present

## 2022-06-30 DIAGNOSIS — R293 Abnormal posture: Secondary | ICD-10-CM

## 2022-06-30 DIAGNOSIS — M6281 Muscle weakness (generalized): Secondary | ICD-10-CM | POA: Diagnosis not present

## 2022-06-30 DIAGNOSIS — M542 Cervicalgia: Secondary | ICD-10-CM
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
## 2022-07-02 ENCOUNTER — Encounter: Payer: Self-pay | Admitting: Rehabilitative and Restorative Service Providers"

## 2022-07-02 ENCOUNTER — Ambulatory Visit: Payer: BC Managed Care – PPO | Admitting: Rehabilitative and Restorative Service Providers"

## 2022-07-02 DIAGNOSIS — M542 Cervicalgia: Secondary | ICD-10-CM | POA: Diagnosis not present

## 2022-07-02 DIAGNOSIS — M6281 Muscle weakness (generalized): Secondary | ICD-10-CM | POA: Diagnosis not present

## 2022-07-02 DIAGNOSIS — G8929 Other chronic pain: Secondary | ICD-10-CM | POA: Diagnosis not present

## 2022-07-02 DIAGNOSIS — R293 Abnormal posture: Secondary | ICD-10-CM
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
## 2022-07-04 ENCOUNTER — Telehealth: Payer: Self-pay

## 2022-07-04 MED ORDER — OSELTAMIVIR PHOSPHATE 75 MG PO CAPS: 75.0000 mg | ORAL_CAPSULE | Freq: Every day | ORAL | 0 refills | Status: DC

## 2022-07-04 NOTE — Addendum Note (Signed)
Addended by: Nani Gasser D on: 07/04/2022 12:09 PM   Modules accepted: Orders

## 2022-07-04 NOTE — Telephone Encounter (Signed)
error 

## 2022-07-04 NOTE — Telephone Encounter (Signed)
Prescription for temp prophylactic Tamiflu sent to pharmacy.  Meds ordered this encounter  Medications   oseltamivir (TAMIFLU) 75 MG capsule    Sig: Take 1 capsule (75 mg total) by mouth daily.    Dispense:  10 capsule    Refill:  0

## 2022-07-09 ENCOUNTER — Ambulatory Visit: Payer: BC Managed Care – PPO | Admitting: Rehabilitative and Restorative Service Providers"

## 2022-07-10 ENCOUNTER — Ambulatory Visit: Payer: BC Managed Care – PPO

## 2022-07-10 DIAGNOSIS — M542 Cervicalgia: Secondary | ICD-10-CM

## 2022-07-10 DIAGNOSIS — G8929 Other chronic pain: Secondary | ICD-10-CM | POA: Diagnosis not present

## 2022-07-10 DIAGNOSIS — R293 Abnormal posture: Secondary | ICD-10-CM | POA: Diagnosis not present

## 2022-07-10 DIAGNOSIS — M6281 Muscle weakness (generalized): Secondary | ICD-10-CM
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
## 2022-07-11 ENCOUNTER — Ambulatory Visit: Payer: BC Managed Care – PPO | Admitting: Sports Medicine

## 2022-07-16 ENCOUNTER — Ambulatory Visit: Payer: BC Managed Care – PPO | Attending: Sports Medicine | Admitting: Rehabilitative and Restorative Service Providers"

## 2022-07-16 ENCOUNTER — Encounter: Payer: Self-pay | Admitting: Rehabilitative and Restorative Service Providers"

## 2022-07-16 ENCOUNTER — Ambulatory Visit: Payer: BC Managed Care – PPO | Admitting: Sports Medicine

## 2022-07-16 DIAGNOSIS — M542 Cervicalgia: Secondary | ICD-10-CM | POA: Diagnosis not present

## 2022-07-16 DIAGNOSIS — M6281 Muscle weakness (generalized): Secondary | ICD-10-CM | POA: Diagnosis present

## 2022-07-16 DIAGNOSIS — R293 Abnormal posture: Secondary | ICD-10-CM | POA: Insufficient documentation

## 2022-07-16 DIAGNOSIS — G8929 Other chronic pain: Secondary | ICD-10-CM | POA: Diagnosis not present

## 2022-07-16 DIAGNOSIS — M25561 Pain in right knee: Secondary | ICD-10-CM | POA: Diagnosis not present

## 2022-07-16 DIAGNOSIS — M25511 Pain in right shoulder: Secondary | ICD-10-CM

## 2022-07-16 NOTE — Assessment & Plan Note (Signed)
Carly Cooper returns, she is a pleasant 22 year old female, chronic right-sided shoulder pain localized over the deltoid, MRI ultimately showed acromioclavicular edema and tendinosis of the supraspinatus. She has responded really well to formal physical therapy and has almost no pain, she can see me back on an as-needed basis and continue with therapy until they graduate her to a home exercise program.
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
## 2022-07-16 NOTE — Progress Notes (Signed)
    Procedures performed today:    None.  Independent interpretation of notes and tests performed by another provider:   None.  Brief History, Exam, Impression, and Recommendations:    Chronic right shoulder pain Carly Cooper returns, she is a pleasant 22 year old female, chronic right-sided shoulder pain localized over the deltoid, MRI ultimately showed acromioclavicular edema and tendinosis of the supraspinatus. She has responded really well to formal physical therapy and has almost no pain, she can see me back on an as-needed basis and continue with therapy until they graduate her to a home exercise program.  Chronic pain of right knee Doing much better, MRI negative, hip abductors are much better in terms of strength, continue PT until graduated to home exercise program and return to see me as needed.    ____________________________________________ [name]. Dianah Field, M.D., ABFM., CAQSM., AME. Primary Care and Sports Medicine [facility]  Adjunct Professor of [place] [employer]  Risk manager

## 2022-07-16 NOTE — Assessment & Plan Note (Signed)
Doing much better, MRI negative, hip abductors are much better in terms of strength, continue PT until graduated to home exercise program and return to see me as needed.

## 2022-07-18 ENCOUNTER — Ambulatory Visit: Payer: BC Managed Care – PPO

## 2022-09-04 ENCOUNTER — Other Ambulatory Visit: Payer: Self-pay | Admitting: Neurology

## 2022-09-04 DIAGNOSIS — F900 Attention-deficit hyperactivity disorder, predominantly inattentive type: Secondary | ICD-10-CM

## 2022-09-04 DIAGNOSIS — G8929 Other chronic pain: Secondary | ICD-10-CM

## 2022-09-04 DIAGNOSIS — F32A Depression, unspecified: Secondary | ICD-10-CM

## 2022-09-04 MED ORDER — MELOXICAM 15 MG PO TABS: ORAL_TABLET | ORAL | 0 refills | Status: DC

## 2022-09-04 MED ORDER — DESVENLAFAXINE SUCCINATE ER 100 MG PO TB24: 100.0000 mg | ORAL_TABLET | Freq: Every day | ORAL | 0 refills | Status: DC

## 2022-09-04 MED ORDER — LISDEXAMFETAMINE DIMESYLATE 40 MG PO CAPS: 40.0000 mg | ORAL_CAPSULE | ORAL | 0 refills | Status: DC

## 2022-09-04 NOTE — Telephone Encounter (Signed)
Patient losing insurance and requesting 90 day supply of all medications. Medications sent. Vyvanse pended for 90 days.

## 2022-09-04 NOTE — Telephone Encounter (Signed)
Will you sign for patient to get 90 days before she loses insurance for 3 months.

## 2022-09-09 ENCOUNTER — Other Ambulatory Visit: Payer: Self-pay | Admitting: Neurology

## 2022-09-09 DIAGNOSIS — F900 Attention-deficit hyperactivity disorder, predominantly inattentive type: Secondary | ICD-10-CM

## 2022-09-09 MED ORDER — LISDEXAMFETAMINE DIMESYLATE 40 MG PO CAPS: 40.0000 mg | ORAL_CAPSULE | ORAL | 0 refills | Status: DC

## 2022-09-09 NOTE — Telephone Encounter (Signed)
Meds just sent.

## 2022-09-09 NOTE — Telephone Encounter (Signed)
Patient called - [facility] has Vyvanse on backorder and she is asking for this to be sent to [facility]. This is for 90 day supply. [place] copied.

## 2022-09-10 ENCOUNTER — Other Ambulatory Visit: Payer: Self-pay | Admitting: Physician Assistant

## 2022-09-10 DIAGNOSIS — F900 Attention-deficit hyperactivity disorder, predominantly inattentive type: Secondary | ICD-10-CM

## 2022-09-10 MED ORDER — VYVANSE 40 MG PO CAPS: 40.0000 mg | ORAL_CAPSULE | ORAL | 0 refills | Status: DC

## 2022-09-30 ENCOUNTER — Other Ambulatory Visit: Payer: Self-pay | Admitting: Physician Assistant

## 2022-09-30 DIAGNOSIS — F419 Anxiety disorder, unspecified: Secondary | ICD-10-CM

## 2022-11-03 ENCOUNTER — Ambulatory Visit (INDEPENDENT_AMBULATORY_CARE_PROVIDER_SITE_OTHER): Payer: 59 | Admitting: Physician Assistant

## 2022-11-03 ENCOUNTER — Encounter: Payer: Self-pay | Admitting: Physician Assistant

## 2022-11-03 VITALS — BP 110/76 | HR 74 | Ht 65.01 in | Wt 199.0 lb

## 2022-11-03 DIAGNOSIS — F419 Anxiety disorder, unspecified: Secondary | ICD-10-CM | POA: Diagnosis not present

## 2022-11-03 DIAGNOSIS — Z6833 Body mass index (BMI) 33.0-33.9, adult: Secondary | ICD-10-CM

## 2022-11-03 DIAGNOSIS — F900 Attention-deficit hyperactivity disorder, predominantly inattentive type: Secondary | ICD-10-CM | POA: Diagnosis not present

## 2022-11-03 DIAGNOSIS — R519 Headache, unspecified: Secondary | ICD-10-CM

## 2022-11-03 DIAGNOSIS — F32A Depression, unspecified: Secondary | ICD-10-CM

## 2022-11-03 DIAGNOSIS — E6609 Other obesity due to excess calories: Secondary | ICD-10-CM

## 2022-11-03 DIAGNOSIS — N946 Dysmenorrhea, unspecified: Secondary | ICD-10-CM

## 2022-11-03 MED ORDER — LO LOESTRIN FE 1 MG-10 MCG / 10 MCG PO TABS: 1.0000 | ORAL_TABLET | Freq: Every day | ORAL | 11 refills | Status: DC

## 2022-11-03 MED ORDER — AMPHETAMINE-DEXTROAMPHET ER 20 MG PO CP24: 20.0000 mg | ORAL_CAPSULE | ORAL | 0 refills | Status: DC

## 2022-11-03 NOTE — Patient Instructions (Signed)
..  Keep headache diary and bring to your next appointment  Lifestyle changes to improve headache frequency:  Send- rescue and daily.   Regular sleep routine -- go to bed at the same time and get up at the same time-- everyday, even on the weekends Regular exercise-40 minutes 3-4 times weekly (150 minutes weekly) Maintaining a healthy weight--portion control Avoid caffeine. Decaf tea or coffee is ok. (limit to150 mg daily) Avoid soda (caffeine or non) Avoid artificial sweetners Avoid alcohol Avoid smoking/secondhand smoke Avoid ibuprofen, acetaminophen, naproxen, pseudoephedrine and other OTC analgesics Avoid opioids, narcotics and prescribed pain medication  Rebound Headaches/Medication overuse headache -- Overusing rescue medications more than twice weekly will worsen headache frequency and intensity over time. Will improve as you wean over the counter medications, ie-- Ibuprofen/ Tylenol/Excedrin, etc. and caffeine- Headaches may get worse before getting better as you withdraw from medication overuse and/or caffeine   Start Magnesium 400-500mg twice daily Start Vitamin B2 100-200mg twice daily OR Migrelief (on Amazon)  

## 2022-11-03 NOTE — Progress Notes (Signed)
Established Patient Office Visit  Subjective   Patient ID: Carly Cooper, female    DOB: 05/27/2001  Age: 22 y.o. MRN: [id number]  Chief Complaint  Patient presents with   Contraception   Headache    HPI Pt is a 22 yo obese female who presents to the clinic with her mother to follow up and discuss some concerns.   Pt cannot get vyvanse right now due to cost. She has not tried anything else. She is in her testing time and school and struggling.   Pt is having irregular painful periods. She is actually cramping a week before and a week after. She has never been on OCP. At times she will have nausea, diarrhea,dizziness around cycle.   She has hx of HAs from when she was 22 yo until now. They seem to be worsening. Describes them as dull to intense. Mostly frontal and behind eyes. Sleep helps the most. Excedrin migraine has also helped. No known trigger. No vision changes or vomiting associated.   .. Active Ambulatory Problems    Diagnosis Date Noted   Migraine without aura and without status migrainosus, not intractable 07/03/2020   Anxiety and depression 07/03/2020   No energy 07/03/2020   Class 2 obesity due to excess calories without serious comorbidity with body mass index (BMI) of 37.0 to 37.9 in adult 07/03/2020   Eczema 07/03/2020   Keratosis pilaris 07/03/2020   Relationship problem with parent 07/03/2020   Iron deficiency anemia secondary to inadequate dietary iron intake 07/03/2020   Vitamin D insufficiency 07/03/2020   Poor concentration 05/20/2021   Inattention 05/20/2021   Family history of attention deficit disorder 05/20/2021   Attention deficit hyperactivity disorder (ADHD), predominantly inattentive type 05/20/2021   Trouble in sleeping 12/24/2021   Right foot pain 02/26/2022   Chronic right shoulder pain 05/30/2022   Chronic pain of right knee 05/30/2022   Class 1 obesity due to excess calories without serious comorbidity with body mass index (BMI) of 33.0 to  33.9 in adult 05/30/2022   Viral respiratory illness 06/17/2022   Dysmenorrhea 11/03/2022   Frequent headaches 11/07/2022   Resolved Ambulatory Problems    Diagnosis Date Noted   Acute pain of right shoulder 09/17/2021   Pain of right clavicle 02/26/2022   Past Medical History:  Diagnosis Date   Anxiety    Depression    Migraine      ROS   See HPI.  Objective:     BP 110/76 (BP Location: Left Arm, Patient Position: Sitting, Cuff Size: Normal)   Pulse 74   Ht 5' 5.01" (1.651 m)   Wt 199 lb (90.3 kg)   SpO2 98%   BMI 33.11 kg/m  BP Readings from Last 3 Encounters:  11/03/22 110/76  06/17/22 131/84  05/28/22 135/72   Wt Readings from Last 3 Encounters:  11/03/22 199 lb (90.3 kg) (97 %, Z= 1.93)*  06/17/22 190 lb (86.2 kg) (96 %, Z= 1.79)*  05/28/22 190 lb (86.2 kg) (96 %, Z= 1.79)*   * Growth percentiles are based on CDC (Girls, 2-20 Years) data.    ..   Physical Exam Constitutional:      Appearance: She is well-developed. She is obese.  HENT:     Head: Normocephalic.  Eyes:     Extraocular Movements: Extraocular movements intact.     Pupils: Pupils are equal, round, and reactive to light.  Cardiovascular:     Rate and Rhythm: Normal rate and regular rhythm.  Pulmonary:  Effort: Pulmonary effort is normal.     Breath sounds: Normal breath sounds.  Abdominal:     Palpations: Abdomen is soft.  Musculoskeletal:     Cervical back: Normal range of motion.  Neurological:     Mental Status: She is alert and oriented to person, place, and time.     Cranial Nerves: No facial asymmetry.     Motor: No weakness.     Gait: Gait normal.  Psychiatric:        Mood and Affect: Mood normal.          Assessment & Plan:  Marland KitchenMarland KitchenBrittane was seen today for contraception and headache.  Diagnoses and all orders for this visit:  Dysmenorrhea -     Norethindrone-Ethinyl Estradiol-Fe Biphas (LO LOESTRIN FE) 1 MG-10 MCG / 10 MCG tablet; Take 1 tablet by mouth  daily.  Attention deficit hyperactivity disorder (ADHD), predominantly inattentive type -     amphetamine-dextroamphetamine (ADDERALL XR) 20 MG 24 hr capsule; Take 1 capsule (20 mg total) by mouth every morning.  Anxiety and depression  Frequent headaches  Class 1 obesity due to excess calories without serious comorbidity with body mass index (BMI) of 33.0 to 33.9 in adult    Discussed birth control options for painful periods Started oral pill Discussed side effects Start after next cycle  Vyvanse too expensive  Sent adderall to try Let me know in the next month  Discussed HA Keep diary Sound more like tension headaches Discussed good sleep, avoid caffeine, start magnesium.  Follow up in 3 months HO given  Spent 55 minutes with patient reviewing chart, discussing symptoms, coordinating care and explaining treatment plan.     Tandy Gaw, PA-C

## 2022-11-07 DIAGNOSIS — R519 Headache, unspecified: Secondary | ICD-10-CM | POA: Insufficient documentation

## 2023-01-07 ENCOUNTER — Telehealth: Payer: Self-pay | Admitting: Physician Assistant

## 2023-03-02 ENCOUNTER — Ambulatory Visit (INDEPENDENT_AMBULATORY_CARE_PROVIDER_SITE_OTHER): Payer: Medicaid Other | Admitting: Physician Assistant

## 2023-03-02 ENCOUNTER — Encounter: Payer: Self-pay | Admitting: Physician Assistant

## 2023-03-02 VITALS — BP 125/75 | HR 78 | Ht 65.0 in | Wt 204.0 lb

## 2023-03-02 DIAGNOSIS — G43009 Migraine without aura, not intractable, without status migrainosus: Secondary | ICD-10-CM

## 2023-03-02 DIAGNOSIS — F32A Depression, unspecified: Secondary | ICD-10-CM | POA: Diagnosis not present

## 2023-03-02 DIAGNOSIS — Z3041 Encounter for surveillance of contraceptive pills: Secondary | ICD-10-CM

## 2023-03-02 DIAGNOSIS — F419 Anxiety disorder, unspecified: Secondary | ICD-10-CM

## 2023-03-02 DIAGNOSIS — F900 Attention-deficit hyperactivity disorder, predominantly inattentive type: Secondary | ICD-10-CM | POA: Diagnosis not present

## 2023-03-02 DIAGNOSIS — R519 Headache, unspecified: Secondary | ICD-10-CM

## 2023-03-02 MED ORDER — NORETHINDRONE ACET-ETHINYL EST 1.5-30 MG-MCG PO TABS: 1.0000 | ORAL_TABLET | Freq: Every day | ORAL | 0 refills | Status: DC

## 2023-03-02 MED ORDER — DESVENLAFAXINE SUCCINATE ER 100 MG PO TB24: 100.0000 mg | ORAL_TABLET | Freq: Every day | ORAL | 3 refills | Status: DC

## 2023-03-02 MED ORDER — LISDEXAMFETAMINE DIMESYLATE 40 MG PO CAPS: 40.0000 mg | ORAL_CAPSULE | ORAL | 0 refills | Status: DC

## 2023-03-02 NOTE — Progress Notes (Unsigned)
Acute Office Visit  Subjective:     Patient ID: Carly Cooper, female    DOB: [birth year], 22 y.o.   MRN: [id number]  Chief Complaint  Patient presents with   Medical Management of Chronic Issues    Birth control    HPI Patient is in today for refills and frequent headaches.   Pt needs OcP refills. No concerns.   She is doing great on vyvanse. No concerns or complaints. She is starting school in a few days at [employer]. Mood is good.   She is having daily headaches behind eyes and at times go into right sided migraine. She went to eye doctor and got new glasses but did not help. Migraines she takes excedrin migraine. Headaches she treats with nothing. No vision changes.   .. Active Ambulatory Problems    Diagnosis Date Noted   Migraine without aura and without status migrainosus, not intractable 07/03/2020   Anxiety and depression 07/03/2020   No energy 07/03/2020   Class 2 obesity due to excess calories without serious comorbidity with body mass index (BMI) of 37.0 to 37.9 in adult 07/03/2020   Eczema 07/03/2020   Keratosis pilaris 07/03/2020   Relationship problem with parent 07/03/2020   Iron deficiency anemia secondary to inadequate dietary iron intake 07/03/2020   Vitamin D insufficiency 07/03/2020   Poor concentration 05/20/2021   Inattention 05/20/2021   Family history of attention deficit disorder 05/20/2021   Attention deficit hyperactivity disorder (ADHD), predominantly inattentive type 05/20/2021   Trouble in sleeping 12/24/2021   Right foot pain 02/26/2022   Chronic right shoulder pain 05/30/2022   Chronic pain of right knee 05/30/2022   Class 1 obesity due to excess calories without serious comorbidity with body mass index (BMI) of 33.0 to 33.9 in adult 05/30/2022   Viral respiratory illness 06/17/2022   Dysmenorrhea 11/03/2022   Frequent headaches 11/07/2022   Encounter for surveillance of contraceptive pills 03/03/2023   Resolved Ambulatory  Problems    Diagnosis Date Noted   Acute pain of right shoulder 09/17/2021   Pain of right clavicle 02/26/2022   Past Medical History:  Diagnosis Date   Anxiety    Depression    Migraine       ROS See HPI.      Objective:    BP 125/75   Pulse 78   Ht 5\' 5"  (1.651 m)   Wt 204 lb (92.5 kg)   SpO2 99%   BMI 33.95 kg/m  BP Readings from Last 3 Encounters:  03/02/23 125/75  11/03/22 110/76  06/17/22 131/84   Wt Readings from Last 3 Encounters:  03/02/23 204 lb (92.5 kg)  11/03/22 199 lb (90.3 kg) (97%, Z= 1.93)*  06/17/22 190 lb (86.2 kg) (96%, Z= 1.79)*   * Growth percentiles are based on CDC (Girls, 2-20 Years) data.    ..    03/03/2023    5:10 PM 05/28/2022    2:30 PM 09/13/2021    4:47 PM 06/21/2021    9:02 AM 05/15/2021   11:54 AM  Depression screen PHQ 2/9  Decreased Interest 1 2 2 3 1   Down, Depressed, Hopeless 1 1 3 1 1   PHQ - 2 Score 2 3 5 4 2   Altered sleeping 0 2 3 3 3   Tired, decreased energy 1 2 3 1 3   Change in appetite 1 1 3 3 3   Feeling bad or failure about yourself  1 0 2 3 1   Trouble concentrating 1 1 1  0  3  Moving slowly or fidgety/restless 0 0 1 1 1   Suicidal thoughts 0 0 0 0 0  PHQ-9 Score 6 9 18 15 16   Difficult doing work/chores Somewhat difficult Somewhat difficult Very difficult Somewhat difficult Very difficult   .Marland Kitchen    03/03/2023    5:10 PM 05/28/2022    2:31 PM 09/13/2021    4:47 PM 06/21/2021    9:04 AM  GAD 7 : Generalized Anxiety Score  Nervous, Anxious, on Edge 1 1 3 3   Control/stop worrying 1 2 3 3   Worry too much - different things 1 2 3 3   Trouble relaxing 0 1 3 3   Restless 1 1 2 1   Easily annoyed or irritable 1 3 2 1   Afraid - awful might happen 1 1 1 1   Total GAD 7 Score 6 11 17 15   Anxiety Difficulty  Somewhat difficult Somewhat difficult Somewhat difficult      Physical Exam Constitutional:      Appearance: Normal appearance. She is obese.  HENT:     Head: Normocephalic.  Cardiovascular:     Rate and  Rhythm: Normal rate and regular rhythm.     Heart sounds: Normal heart sounds.  Pulmonary:     Effort: Pulmonary effort is normal.     Breath sounds: Normal breath sounds.  Neurological:     General: No focal deficit present.     Mental Status: She is alert and oriented to person, place, and time.  Psychiatric:        Mood and Affect: Mood normal.          Assessment & Plan:  Marland KitchenMarland KitchenZakirah was seen today for medical management of chronic issues.  Diagnoses and all orders for this visit:  Anxiety and depression -     desvenlafaxine (PRISTIQ) 100 MG 24 hr tablet; Take 1 tablet (100 mg total) by mouth daily.  Attention deficit hyperactivity disorder (ADHD), predominantly inattentive type -     lisdexamfetamine (VYVANSE) 40 MG capsule; Take 1 capsule (40 mg total) by mouth every morning.  Encounter for surveillance of contraceptive pills -     Norethindrone Acetate-Ethinyl Estradiol (LOESTRIN 1.5/30, 21,) 1.5-30 MG-MCG tablet; Take 1 tablet by mouth daily.  Frequent headaches  Migraine without aura and without status migrainosus, not intractable   Does not need pap until 23.  Loestrin refilled STD testing declined  Vyvanse refilled  Discussed HA's Start magnesium at bedtime.  Follow up in 3 months if not improving then consider preventative   Return in about 3 months (around 06/02/2023).  Tandy Gaw, PA-C

## 2023-03-02 NOTE — Patient Instructions (Addendum)
Magnesium 400mg  at bedtime.  If not improving then consider preventative for migraine/headaches   Regular sleep routine -- go to bed at the same time and get up at the same time-- everyday, even on the weekends Regular exercise-40 minutes 3-4 times weekly (150 minutes weekly) Maintaining a healthy weight--portion control Avoid caffeine. Decaf tea or coffee is ok. (limit to150 mg daily) Avoid soda (caffeine or non) Avoid artificial sweetners Avoid alcohol Avoid smoking/secondhand smoke Avoid ibuprofen, acetaminophen, naproxen, pseudoephedrine and other OTC analgesics Avoid opioids, narcotics and prescribed pain medication  Rebound Headaches/Medication overuse headache -- Overusing rescue medications more than twice weekly will worsen headache frequency and intensity over time. Will improve as you wean over the counter medications, ie-- Ibuprofen/ Tylenol/Excedrin, etc. and caffeine- Headaches may get worse before getting better as you withdraw from medication overuse and/or caffeine   Start Magnesium 400-500mg  twice daily Start Vitamin B2 100-200mg  twice daily OR Migrelief (on [employer])

## 2023-03-03 ENCOUNTER — Encounter: Payer: Self-pay | Admitting: Physician Assistant

## 2023-03-03 DIAGNOSIS — Z3041 Encounter for surveillance of contraceptive pills: Secondary | ICD-10-CM | POA: Insufficient documentation

## 2023-03-23 ENCOUNTER — Other Ambulatory Visit: Payer: Self-pay | Admitting: Physician Assistant

## 2023-03-23 DIAGNOSIS — Z20828 Contact with and (suspected) exposure to other viral communicable diseases: Secondary | ICD-10-CM

## 2023-03-23 DIAGNOSIS — R6889 Other general symptoms and signs: Secondary | ICD-10-CM

## 2023-03-23 MED ORDER — OSELTAMIVIR PHOSPHATE 75 MG PO CAPS: 75.0000 mg | ORAL_CAPSULE | Freq: Two times a day (BID) | ORAL | 0 refills | Status: DC

## 2023-04-13 ENCOUNTER — Telehealth: Payer: Self-pay | Admitting: Physician Assistant

## 2023-04-13 NOTE — Telephone Encounter (Signed)
Prescription Request  04/13/2023  LOV: 03/02/2023  What is the name of the medication or equipment?  VYVANSE(40mg  tab)  Have you contacted your pharmacy to request a refill? Yes   Which pharmacy would you like this sent to? [facility] on [address]   Patient notified that their request is being sent to the clinical staff for review and that they should receive a response within 2 business days.   Please advise at Mobile [phone] (mobile)

## 2023-04-30 ENCOUNTER — Other Ambulatory Visit: Payer: Self-pay | Admitting: Physician Assistant

## 2023-04-30 DIAGNOSIS — Z3041 Encounter for surveillance of contraceptive pills: Secondary | ICD-10-CM

## 2023-06-02 ENCOUNTER — Encounter: Payer: Self-pay | Admitting: Physician Assistant

## 2023-06-02 ENCOUNTER — Ambulatory Visit (INDEPENDENT_AMBULATORY_CARE_PROVIDER_SITE_OTHER): Payer: Medicaid Other | Admitting: Physician Assistant

## 2023-06-02 VITALS — BP 115/78 | HR 86 | Ht 65.0 in | Wt 205.0 lb

## 2023-06-02 DIAGNOSIS — R519 Headache, unspecified: Secondary | ICD-10-CM

## 2023-06-02 DIAGNOSIS — Z79899 Other long term (current) drug therapy: Secondary | ICD-10-CM

## 2023-06-02 DIAGNOSIS — D508 Other iron deficiency anemias: Secondary | ICD-10-CM

## 2023-06-02 DIAGNOSIS — F900 Attention-deficit hyperactivity disorder, predominantly inattentive type: Secondary | ICD-10-CM | POA: Diagnosis not present

## 2023-06-02 DIAGNOSIS — G43009 Migraine without aura, not intractable, without status migrainosus: Secondary | ICD-10-CM | POA: Diagnosis not present

## 2023-06-02 DIAGNOSIS — Z30015 Encounter for initial prescription of vaginal ring hormonal contraceptive: Secondary | ICD-10-CM | POA: Insufficient documentation

## 2023-06-02 MED ORDER — LISDEXAMFETAMINE DIMESYLATE 40 MG PO CAPS: 40.0000 mg | ORAL_CAPSULE | ORAL | 0 refills | Status: DC

## 2023-06-02 MED ORDER — ETONOGESTREL-ETHINYL ESTRADIOL 0.12-0.015 MG/24HR VA RING: VAGINAL_RING | VAGINAL | 4 refills | Status: DC

## 2023-06-02 MED ORDER — TOPIRAMATE 50 MG PO TABS: ORAL_TABLET | ORAL | 2 refills | Status: DC

## 2023-06-02 NOTE — Patient Instructions (Signed)
Referral made for GYN Start nuva ring  Etonogestrel; Ethinyl Estradiol Vaginal Ring What is this medication? ETONOGESTREL; ETHINYL ESTRADIOL (et oh noe JES trel; ETH in il es tra DYE ole) prevents ovulation and pregnancy. It belongs to a group of medications called contraceptives. It is a combination of the hormones estrogen and progestin. This medicine may be used for other purposes; ask your health care provider or pharmacist if you have questions. COMMON BRAND NAME(S): EluRyng, EnilloRing, HALOETTE, NuvaRing What should I tell my care team before I take this medication? They need to know if you have any of these conditions: Abnormal vaginal bleeding Blood clots Blood vessel disease Breast, cervical, endometrial, ovarian, liver, or uterine cancer Diabetes Gallbladder disease Having surgery Heart disease or recent heart attack High blood pressure High cholesterol or triglycerides History of irregular heartbeat or heart valve problems Kidney disease Liver disease Lupus Migraine headaches Protein C or S deficiency Recently had a baby, miscarriage, or abortion Stroke Tobacco use An unusual or allergic reaction to estrogens, progestins, other medications, foods, dyes, or preservatives Pregnant or trying to get pregnant Breastfeeding How should I use this medication? Insert the ring into your vagina as directed. Follow the directions on the prescription label. The ring will remain place for 3 weeks and is then removed for a 1-week break. A new ring is inserted 1 week after the last ring was removed, on the same day of the week. Check often to make sure the ring is still in place. If the ring was out of the vagina for an unknown amount of time, you may not be protected from pregnancy. Perform a pregnancy test and call your care team. Do not use more often than directed. A patient package insert for the product will be given with each prescription and refill. Read this sheet carefully  each time. The sheet may change frequently. Contact your care team regarding the use of this medication in children. Special care may be needed. Overdosage: If you think you have taken too much of this medicine contact a poison control center or emergency room at once. NOTE: This medicine is only for you. Do not share this medicine with others. What if I miss a dose? You will need to use the ring exactly as directed. It is very important to follow the schedule every cycle. If you do not use the ring as directed, you may not be protected from pregnancy. If the ring should slip out, is lost, or if you leave it in longer or shorter than you should, contact your care team for advice. What may interact with this medication? Do not take this medication with the following: Dasabuvir; ombitasvir; paritaprevir; ritonavir Ombitasvir; paritaprevir; ritonavir Vaginal lubricants or other vaginal products that are oil-based or silicone-based This medication may also interact with the following: Acetaminophen Antibiotics or medications for infections, especially rifampin, rifabutin, rifapentine, griseofulvin, penicillins, or tetracyclines Aprepitant or fosaprepitant Armodafinil Ascorbic acid (vitamin C) Barbiturate medications, such as phenobarbital or primidone Bosentan Certain antivirals for HIV or hepatitis Certain medications for cancer treatment Certain medications for cholesterol Certain medications for seizures, such as carbamazepine, clobazam, felbamate, lamotrigine, oxcarbazepine, phenytoin, rufinamide, or topiramate Cyclosporine Dantrolene Elagolix Flibanserin Grapefruit juice Lesinurad Medications for diabetes Medications to treat fungal infections, such as griseofulvin, miconazole, fluconazole, ketoconazole, itraconazole, posaconazole, or voriconazole Mifepristone Mitotane Modafinil Morphine Mycophenolate St. John's wort Tamoxifen Temazepam Theophylline or aminophylline Thyroid  hormones Tizanidine Tranexamic acid Ulipristal Warfarin This list may not describe all possible interactions. Give your health care  provider a list of all the medicines, herbs, non-prescription drugs, or dietary supplements you use. Also tell them if you smoke, drink alcohol, or use illegal drugs. Some items may interact with your medicine. What should I watch for while using this medication? Visit your care team for regular checks on your progress. You will need a regular breast and pelvic exam and Pap smear while on this medication. Check with your care team to see if you need an additional method of contraception during the first cycle that you use this ring. Female condoms (made with natural rubber latex, polyisoprene, and polyurethane) and spermicides may be used. Do not use a diaphragm, cervical cap, or a female condom, as the ring can interfere with these birth control methods and their proper placement. If you have any reason to think you are pregnant, stop using this medication right away and contact your care team. If you are using this medication for hormone related problems, it may take several cycles of use to see improvement in your condition. Smoking tobacco increases the risk of getting a blood clot or having a stroke while you are taking this medication, especially if you are older than 35 years. You may get dark patches on your face (chloasma) while taking this medication. If you noticed dark patches on your face during a pregnancy, your risk of getting it is higher. Keep out of the sun. If you cannot avoid the sun, wear protective clothing and use sunscreen. Do not use sun lamps, tanning beds, or tanning booths. This medication can make your body retain fluid, making your fingers, hands, or ankles swell. Your blood pressure can go up. Contact your care team if you feel you are retaining fluid. If you are going to have elective surgery, you may need to stop using this medication before  the surgery. Consult your care team for advice. Using this medication does not protect you or your partner against HIV or other sexually transmitted infections (STIs). What side effects may I notice from receiving this medication? Side effects that you should report to your care team as soon as possible: Allergic reactions--skin rash, itching, hives, swelling of the face, lips, tongue, or throat Blood clot--pain, swelling, or warmth in the leg, shortness of breath, chest pain Gallbladder problems--severe stomach pain, nausea, vomiting, fever Increase in blood pressure Liver injury--right upper belly pain, loss of appetite, nausea, light-colored stool, dark yellow or brown urine, yellowing skin or eyes, unusual weakness or fatigue New or worsening migraines or headaches Stroke--sudden numbness or weakness of the face, arm, or leg, trouble speaking, confusion, trouble walking, loss of balance or coordination, dizziness, severe headache, change in vision Toxic shock syndrome--fever, headache, general discomfort and fatigue, vomiting, diarrhea, rash or peeling of the skin over hands or feet Unusual vaginal discharge, itching, or odor Vaginal pain, irritation, or sores Worsening mood, feelings of depression Side effects that usually do not require medical attention (report to your care team if they continue or are bothersome): Breast pain or tenderness Dark patches of skin on the face or other sun-exposed areas Irregular menstrual cycles or spotting Nausea Weight gain This list may not describe all possible side effects. Call your doctor for medical advice about side effects. You may report side effects to FDA at 1-800-FDA-1088. Where should I keep my medication? Keep out of the reach of children and pets. Store unopened medication for up to 4 months at room temperature at 15 and 30 degrees C (59 and 86 degrees F). Protect  from light. Do not store above 30 degrees C (86 degrees F). Throw away any  unused medication 4 months after the dispense date or the expiration date, whichever comes first. A ring may only be used for 1 cycle (1 month). After the 3-week cycle, a used ring is removed and should be placed in the re-closable foil pouch and discarded in the trash out of reach of children and pets. Do NOT flush down the toilet. NOTE: This sheet is a summary. It may not cover all possible information. If you have questions about this medicine, talk to your doctor, pharmacist, or health care provider.  2024 Elsevier/Gold Standard (2022-02-04 00:00:00)

## 2023-06-02 NOTE — Progress Notes (Signed)
Established Patient Office Visit  Subjective   Patient ID: Carly Cooper, female    DOB: Jan 09, 2001  Age: 22 y.o. MRN: [id number]  Chief Complaint  Patient presents with   Medical Management of Chronic Issues    Med consult and med refill    HPI Patient presents today for medication refills and to discuss birth control change. She uses OCP for menstrual cycle control and not pregnancy prevention. She is in a same sex relationship. Her mood, focus, and sleep are good. Patient states that she forgets to take her birth control pills sometimes. She is interested in an option that does not involve an everyday pill.    Patient also reports that her HAs are not as frequent after starting Mg at night, but she is still having HAs 3-4x/wk. Excedrin migraine helps but she tries not to take this frequently. She is interested in preventative medication.   .. Active Ambulatory Problems    Diagnosis Date Noted   Migraine without aura and without status migrainosus, not intractable 07/03/2020   Anxiety and depression 07/03/2020   No energy 07/03/2020   Class 2 obesity due to excess calories without serious comorbidity with body mass index (BMI) of 37.0 to 37.9 in adult 07/03/2020   Eczema 07/03/2020   Keratosis pilaris 07/03/2020   Relationship problem with parent 07/03/2020   Iron deficiency anemia secondary to inadequate dietary iron intake 07/03/2020   Vitamin D insufficiency 07/03/2020   Poor concentration 05/20/2021   Inattention 05/20/2021   Family history of attention deficit disorder 05/20/2021   Attention deficit hyperactivity disorder (ADHD), predominantly inattentive type 05/20/2021   Trouble in sleeping 12/24/2021   Right foot pain 02/26/2022   Chronic right shoulder pain 05/30/2022   Chronic pain of right knee 05/30/2022   Class 1 obesity due to excess calories without serious comorbidity with body mass index (BMI) of 33.0 to 33.9 in adult 05/30/2022   Viral respiratory illness  06/17/2022   Dysmenorrhea 11/03/2022   Frequent headaches 11/07/2022   Encounter for surveillance of contraceptive pills 03/03/2023   Encounter for initial prescription of vaginal ring hormonal contraceptive 06/02/2023   Resolved Ambulatory Problems    Diagnosis Date Noted   Acute pain of right shoulder 09/17/2021   Pain of right clavicle 02/26/2022   Past Medical History:  Diagnosis Date   Anxiety    Depression    Migraine       Review of Systems  Neurological:  Positive for headaches.  Psychiatric/Behavioral:  Negative for depression. The patient is not nervous/anxious.       Objective:     BP 115/78   Pulse 86   Ht 5\' 5"  (1.651 m)   Wt 205 lb (93 kg)   SpO2 99%   BMI 34.11 kg/m    Physical Exam Constitutional:      Appearance: Normal appearance. She is obese.  HENT:     Head: Normocephalic.  Cardiovascular:     Rate and Rhythm: Normal rate and regular rhythm.     Pulses: Normal pulses.     Heart sounds: Normal heart sounds.  Pulmonary:     Effort: Pulmonary effort is normal.     Breath sounds: Normal breath sounds.  Neurological:     General: No focal deficit present.     Mental Status: She is alert and oriented to person, place, and time.  Psychiatric:        Mood and Affect: Mood normal.       Assessment &  Plan:  ..Diaja was seen today for medical management of chronic issues.  Diagnoses and all orders for this visit:  Attention deficit hyperactivity disorder (ADHD), predominantly inattentive type -     lisdexamfetamine (VYVANSE) 40 MG capsule; Take 1 capsule (40 mg total) by mouth every morning.  Iron deficiency anemia secondary to inadequate dietary iron intake -     CBC w/Diff/Platelet -     Fe+TIBC+Fer  Medication management -     CMP14+EGFR -     CBC w/Diff/Platelet -     Fe+TIBC+Fer  Encounter for initial prescription of vaginal ring hormonal contraceptive -     Ambulatory referral to Obstetrics / Gynecology -      etonogestrel-ethinyl estradiol (NUVARING) 0.12-0.015 MG/24HR vaginal ring; Insert vaginally and leave in place for 3 consecutive weeks, then remove for 1 week.  Frequent headaches -     topiramate (TOPAMAX) 50 MG tablet; One half tab p.o. nightly for 1 week then 1 tab p.o. nightly for 1 week then 1 tab p.o. twice daily  Migraine without aura and without status migrainosus, not intractable -     topiramate (TOPAMAX) 50 MG tablet; One half tab p.o. nightly for 1 week then 1 tab p.o. nightly for 1 week then 1 tab p.o. twice daily    Discussed birth control options with patient including Nuvaring, patch, pill, Nexplanon, Depo-Provera, and IUD. Patient decided on Nuvaring. Referral for GYN at patients request.  Discussed how to use nuva ring and HO provided.  Pap at 23.   Topamax for prevention of headaches.  Discussed common SEs of Topamax including tingling, weight loss, decreased word finding ability, and change in taste. Follow up in 3 months to assess response to treatment. Follow up sooner if SEs arise or if not working.  Refilled Vyvanse for 3 months.   CMP and iron collected today to follow up up on medication management.   Patient declined flu vaccine.  Spent 40 minutes with patient discussing birth control options and how to use nuva ring, risk/SE of topamax and overall treatment management.   Return in about 3 months (around 09/02/2023).    Tandy Gaw, PA-C

## 2023-06-03 LAB — CMP14+EGFR
ALT: 8 [IU]/L (ref 0–32)
AST: 14 [IU]/L (ref 0–40)
Albumin: 4.4 g/dL (ref 4.0–5.0)
Alkaline Phosphatase: 61 [IU]/L (ref 42–106)
BUN/Creatinine Ratio: 13 (ref 9–23)
BUN: 9 mg/dL (ref 6–20)
Bilirubin Total: 0.2 mg/dL (ref 0.0–1.2)
CO2: 23 mmol/L (ref 20–29)
Calcium: 9.3 mg/dL (ref 8.7–10.2)
Chloride: 102 mmol/L (ref 96–106)
Creatinine, Ser: 0.69 mg/dL (ref 0.57–1.00)
Globulin, Total: 2.5 g/dL (ref 1.5–4.5)
Glucose: 89 mg/dL (ref 70–99)
Potassium: 4 mmol/L (ref 3.5–5.2)
Sodium: 139 mmol/L (ref 134–144)
Total Protein: 6.9 g/dL (ref 6.0–8.5)
eGFR: 127 mL/min/{1.73_m2} (ref >59)

## 2023-06-03 LAB — CBC WITH DIFFERENTIAL/PLATELET
Basophils Absolute: 0.1 10*3/uL (ref 0.0–0.2)
Basos: 1 %
EOS (ABSOLUTE): 0.1 10*3/uL (ref 0.0–0.4)
Eos: 2 %
Hematocrit: 39.7 % (ref 34.0–46.6)
Hemoglobin: 12.8 g/dL (ref 11.1–15.9)
Immature Grans (Abs): 0 10*3/uL (ref 0.0–0.1)
Immature Granulocytes: 0 %
Lymphocytes Absolute: 2.3 10*3/uL (ref 0.7–3.1)
Lymphs: 50 %
MCH: 29.2 pg (ref 26.6–33.0)
MCHC: 32.2 g/dL (ref 31.5–35.7)
MCV: 91 fL (ref 79–97)
Monocytes Absolute: 0.4 10*3/uL (ref 0.1–0.9)
Monocytes: 9 %
Neutrophils Absolute: 1.7 10*3/uL (ref 1.4–7.0)
Neutrophils: 38 %
Platelets: 282 10*3/uL (ref 150–450)
RBC: 4.38 x10E6/uL (ref 3.77–5.28)
RDW: 12.7 % (ref 11.7–15.4)
WBC: 4.6 10*3/uL (ref 3.4–10.8)

## 2023-06-03 LAB — IRON,TIBC AND FERRITIN PANEL
Ferritin: 39 ng/mL (ref 15–150)
Iron Saturation: 7 % — CL (ref 15–55)
Iron: 23 ug/dL — ABNORMAL LOW (ref 27–159)
Total Iron Binding Capacity: 322 ug/dL (ref 250–450)
UIBC: 299 ug/dL (ref 131–425)

## 2023-06-03 NOTE — Progress Notes (Signed)
Iron is still low. You need to be taking a daily supplement. Do you have one to take?

## 2023-06-10 ENCOUNTER — Telehealth: Payer: Self-pay

## 2023-06-10 NOTE — Telephone Encounter (Signed)
Copied from CRM [id number]. Topic: Clinical - Lab/Test Results >> Jun 10, 2023  8:50 AM Donita Brooks wrote: Reason for CRM: pt miss call from nurse to discuss test results, did mention to pt that can also check their results through MyChart.

## 2023-06-15 MED ORDER — FERROUS SULFATE 325 (65 FE) MG PO TBEC: 325.0000 mg | DELAYED_RELEASE_TABLET | Freq: Three times a day (TID) | ORAL | 11 refills | Status: AC

## 2023-06-15 NOTE — Addendum Note (Signed)
Addended by: Jomarie Longs on: 06/15/2023 09:38 AM   Modules accepted: Orders

## 2023-07-31 ENCOUNTER — Other Ambulatory Visit: Payer: Self-pay | Admitting: Physician Assistant

## 2023-07-31 DIAGNOSIS — Z3041 Encounter for surveillance of contraceptive pills: Secondary | ICD-10-CM

## 2023-08-18 ENCOUNTER — Other Ambulatory Visit: Payer: Self-pay | Admitting: Physician Assistant

## 2023-08-18 MED ORDER — VYVANSE 40 MG PO CAPS: 40.0000 mg | ORAL_CAPSULE | ORAL | 0 refills | Status: DC

## 2023-09-02 ENCOUNTER — Ambulatory Visit (INDEPENDENT_AMBULATORY_CARE_PROVIDER_SITE_OTHER): Payer: Medicaid Other | Admitting: Physician Assistant

## 2023-09-02 ENCOUNTER — Encounter: Payer: Self-pay | Admitting: Physician Assistant

## 2023-09-02 VITALS — BP 104/73 | HR 74 | Ht 65.0 in | Wt 201.2 lb

## 2023-09-02 DIAGNOSIS — G43009 Migraine without aura, not intractable, without status migrainosus: Secondary | ICD-10-CM

## 2023-09-02 DIAGNOSIS — F900 Attention-deficit hyperactivity disorder, predominantly inattentive type: Secondary | ICD-10-CM

## 2023-09-02 DIAGNOSIS — L231 Allergic contact dermatitis due to adhesives: Secondary | ICD-10-CM | POA: Insufficient documentation

## 2023-09-02 MED ORDER — TRIAMCINOLONE ACETONIDE 0.1 % EX CREA: 1.0000 | TOPICAL_CREAM | Freq: Two times a day (BID) | CUTANEOUS | 0 refills | Status: DC

## 2023-09-02 MED ORDER — VYVANSE 40 MG PO CAPS: 40.0000 mg | ORAL_CAPSULE | ORAL | 0 refills | Status: DC

## 2023-09-02 MED ORDER — NURTEC 75 MG PO TBDP: 1.0000 | ORAL_TABLET | ORAL | 11 refills | Status: AC

## 2023-09-02 MED ORDER — VYVANSE 40 MG PO CAPS
40.0000 mg | ORAL_CAPSULE | ORAL | 0 refills | Status: DC
Start: 2023-09-02 — End: 2024-03-02

## 2023-09-02 MED ORDER — ONDANSETRON 8 MG PO TBDP: 8.0000 mg | ORAL_TABLET | Freq: Three times a day (TID) | ORAL | 1 refills | Status: DC | PRN

## 2023-09-02 NOTE — Progress Notes (Signed)
 Established Patient Office Visit  Subjective   Patient ID: Carly Cooper, female    DOB: Nov 06, 2000  Age: 23 y.o. MRN: [id number]  CC: ADHD medication refills and follow up   HPI Patient is a 23 yo female who presents for ADHD follow up appointment. She is currently on Vyvanse 40mg  daily. She feels this is working well for her. Denies trouble eating or with sleep.   She was taking Topamax 50mg  tablet BID for prevention of headaches & migraines. She states she did not tolerate Topamax, gave her head fullness and made headaches worse. She has been drinking more water, which has been helping. She is continuing to have at least 4 headaches a week. She went to the eye doctor yesterday and got new glasses so she is hoping this will help. Endorses nausea with her migraines.   She had an allergic rxn to a bandaid under her left breast line in January. It is currently still itchy and irritated. She states it did blister but that has resolved.   .. Active Ambulatory Problems    Diagnosis Date Noted   Migraine without aura and without status migrainosus, not intractable 07/03/2020   Anxiety and depression 07/03/2020   No energy 07/03/2020   Class 2 obesity due to excess calories without serious comorbidity with body mass index (BMI) of 37.0 to 37.9 in adult 07/03/2020   Eczema 07/03/2020   Keratosis pilaris 07/03/2020   Relationship problem with parent 07/03/2020   Iron deficiency anemia secondary to inadequate dietary iron intake 07/03/2020   Vitamin D insufficiency 07/03/2020   Poor concentration 05/20/2021   Inattention 05/20/2021   Family history of attention deficit disorder 05/20/2021   Attention deficit hyperactivity disorder (ADHD), predominantly inattentive type 05/20/2021   Trouble in sleeping 12/24/2021   Right foot pain 02/26/2022   Chronic right shoulder pain 05/30/2022   Chronic pain of right knee 05/30/2022   Class 1 obesity due to excess calories without serious  comorbidity with body mass index (BMI) of 33.0 to 33.9 in adult 05/30/2022   Viral respiratory illness 06/17/2022   Dysmenorrhea 11/03/2022   Frequent headaches 11/07/2022   Encounter for surveillance of contraceptive pills 03/03/2023   Encounter for initial prescription of vaginal ring hormonal contraceptive 06/02/2023   Allergic contact dermatitis due to adhesives 09/02/2023   Resolved Ambulatory Problems    Diagnosis Date Noted   Acute pain of right shoulder 09/17/2021   Pain of right clavicle 02/26/2022   Past Medical History:  Diagnosis Date   Anxiety    Depression    Migraine     Review of Systems  Neurological:  Positive for headaches.  All other systems reviewed and are negative.    Objective:     BP 104/73 (BP Location: Left Arm, Patient Position: Sitting, Cuff Size: Large)   Pulse 74   Ht 5\' 5"  (1.651 m)   Wt 201 lb 4 oz (91.3 kg)   SpO2 97%   BMI 33.49 kg/m  BP Readings from Last 3 Encounters:  09/02/23 104/73  06/02/23 115/78  03/02/23 125/75   Wt Readings from Last 3 Encounters:  09/02/23 201 lb 4 oz (91.3 kg)  06/02/23 205 lb (93 kg)  03/02/23 204 lb (92.5 kg)   Physical Exam Constitutional:      Appearance: Normal appearance.  HENT:     Head: Normocephalic and atraumatic.  Eyes:     Extraocular Movements: Extraocular movements intact.  Cardiovascular:     Rate and Rhythm: Normal rate  and regular rhythm.     Pulses: Normal pulses.     Heart sounds: Normal heart sounds.  Pulmonary:     Breath sounds: Normal breath sounds.  Musculoskeletal:        General: Normal range of motion.     Cervical back: Normal range of motion.  Skin:    General: Skin is warm.  Neurological:     Mental Status: She is alert.  Psychiatric:        Mood and Affect: Mood normal.       Assessment & Plan:  Marland KitchenMarland KitchenAinslee was seen today for medical management of chronic issues.  Diagnoses and all orders for this visit:  Migraine without aura and without status  migrainosus, not intractable -     Rimegepant Sulfate (NURTEC) 75 MG TBDP; Take 1 tablet (75 mg total) by mouth every other day. -     ondansetron (ZOFRAN-ODT) 8 MG disintegrating tablet; Take 1 tablet (8 mg total) by mouth every 8 (eight) hours as needed.  Attention deficit hyperactivity disorder (ADHD), predominantly inattentive type -     VYVANSE 40 MG capsule; Take 1 capsule (40 mg total) by mouth every morning. -     VYVANSE 40 MG capsule; Take 1 capsule (40 mg total) by mouth every morning. -     VYVANSE 40 MG capsule; Take 1 capsule (40 mg total) by mouth every morning.  Allergic contact dermatitis due to adhesives -     triamcinolone cream (KENALOG) 0.1 %; Apply 1 Application topically 2 (two) times daily.    Migraines:  - Nurtec every other day for prevention   - Continue Excerdrin as needed - Zofran for nausea  Rash under breast line:   - Avoid adhesive bandaids, added to allergy list   - prescribed topical steroid to keep on hand ADHD:   - Refilled Vyvanse  Follow up in 6 months   Tandy Gaw, PA-C

## 2023-09-02 NOTE — Patient Instructions (Signed)
 Nurtec every other day for migraine prevention Continue Vyvanse

## 2023-09-24 ENCOUNTER — Ambulatory Visit: Payer: Self-pay | Admitting: Physician Assistant

## 2023-09-24 NOTE — Telephone Encounter (Signed)
 Copied from CRM [id number]. Topic: Clinical - Red Word Triage >> Sep 24, 2023  9:29 AM Carly Cooper wrote: Red Word that prompted transfer to Nurse Triage: Patient states that there is redness and swelling near tailbone with block dots that is painful - significant other noticed it last night. Tailbone has started randomly hurting before but has discovered the dots last night.  Chief Complaint: reddness, swelling, black dots to tailbone Symptoms: see above Frequency: started yesterday Pertinent Negatives: Patient denies fever, numbness, sob, cp, purple dots Disposition: [] ED /[] Urgent Care (no appt availability in office) / [x] Appointment(In office/virtual)/ []  [facility]/ [] Home Care/ [] Refused Recommended Disposition /[] [facility]/ []  Follow-up with PCP Additional Notes: per protocol apt made for tomorrow.  Care advice given, denies questions; instructed to go to ER if becomes worse.   Reason for Disposition  [1] Localized rash is very painful AND [2] no fever  Answer Assessment - Initial Assessment Questions 1. APPEARANCE of RASH: "Describe the rash."      Dark red with black dots 2. LOCATION: "Where is the rash located?"      tailbone 3. NUMBER: "How many spots are there?"      5-6 4. SIZE: "How big are the spots?" (Inches, centimeters or compare to size of a coin)      Tip of pencil side 5. ONSET: "When did the rash start?"      Pain started yesterday and "reddness and swelling" started last night 6. ITCHING: "Does the rash itch?" If Yes, ask: "How bad is the itch?"  (Scale 0-10; or none, mild, moderate, severe)     1-2/10 7. PAIN: "Does the rash hurt?" If Yes, ask: "How bad is the pain?"  (Scale 0-10; or none, mild, moderate, severe)    - NONE (0): no pain    - MILD (1-3): doesn't interfere with normal activities     - MODERATE (4-7): interferes with normal activities or awakens from sleep     - SEVERE (8-10): excruciating pain, unable to do any normal  activities     4/10 when sitting; doesn't hurt as bad when standing  8. OTHER SYMPTOMS: "Do you have any other symptoms?" (e.g., fever)     denies 9. PREGNANCY: "Is there any chance you are pregnant?" "When was your last menstrual period?"     Denies.  Protocols used: Rash or Redness - Localized-A-AH

## 2023-09-25 ENCOUNTER — Ambulatory Visit (INDEPENDENT_AMBULATORY_CARE_PROVIDER_SITE_OTHER): Admitting: Physician Assistant

## 2023-09-25 ENCOUNTER — Encounter: Payer: Self-pay | Admitting: Physician Assistant

## 2023-09-25 VITALS — BP 112/83 | HR 77 | Ht 65.0 in | Wt 204.8 lb

## 2023-09-25 DIAGNOSIS — L0591 Pilonidal cyst without abscess: Secondary | ICD-10-CM

## 2023-09-25 MED ORDER — DOXYCYCLINE HYCLATE 100 MG PO TABS: 100.0000 mg | ORAL_TABLET | Freq: Two times a day (BID) | ORAL | 0 refills | Status: DC

## 2023-09-25 NOTE — Patient Instructions (Signed)
 Pilonidal Cyst    A pilonidal cyst is a fluid-filled sac that forms under the skin near the tailbone, at the top of the crease of the buttocks (pilonidal area). Cysts that are small and not infected may not cause any problems.  Cysts that become irritated or infected may grow and fill with pus. An infected cyst is called an abscess. A pilonidal abscess may cause pain and swelling. It may need to be drained or removed.  What are the causes?  The cause of this condition is not always known. In some cases, it may be caused by a hair that grows into your skin (ingrown hair).  What increases the risk?  You are more likely to develop this condition if:  You are female.  You have lots of hair near the crease of the buttocks.  You are overweight.  You have a dimple near the crease of the buttocks.  You wear tight clothing.  You do not bathe or shower often.  You sit for long periods of time.  What are the signs or symptoms?  Symptoms of this condition may include pain, swelling, redness, and warmth in the pilonidal area. You may also be able to feel a lump near your tailbone if your cyst is big.  If your cyst becomes infected, symptoms may include:  Pus or fluid drainage.  Fever.  Pain, swelling, and redness getting worse.  The lump getting bigger.  How is this diagnosed?  This condition may be diagnosed based on:  Your symptoms and medical history.  A physical exam.  A blood test to check for infection.  A test of a pus sample.  How is this treated?  You may not need any treatment if your cyst does not cause symptoms. If your cyst bothers you or is infected, you may need a procedure to drain or remove the cyst. Depending on the size, location, and severity of your cyst, your health care provider may:  Make an incision in the cyst and drain it (incision anddrainage).  Open and drain the cyst, and then stitch the wound so that it stays open while it heals (marsupialization). You will be given instructions about how to care for  your open wound while it heals.  Remove all or part of the cyst, and then close the wound (cyst removal).  You may need to take antibiotics before your procedure.  Follow these instructions at home:  Medicines  Take over-the-counter and prescription medicines only as told by your health care provider.  If you were prescribed antibiotics, take them as told by your health care provider. Do not stop taking the antibiotic even if you start to feel better.  General instructions  Keep the area around the cyst clean and dry.  If there is fluid or pus draining from your cyst:  Cover the area with a clean bandage (dressing).  Wash the area gently with soap and water. Pat the area dry with a clean towel. Do not rub the area because that may cause bleeding.  Remove hair from the area around the cyst only if your health care provider tells you to.  Do not wear tight pants or sit in one position for long periods at a time.  Contact a health care provider if:  You have new redness, swelling, or pain.  You have a fever.  You have severe pain.  Summary  A pilonidal cyst is a fluid-filled sac that forms under the skin near the tailbone, at  the top of the crease of the buttocks (pilonidal area).  Cysts that become irritated or infected may grow and fill with pus. An infected cyst is called an abscess.  The cause of this condition is not always known. In some cases, it may be caused by a hair that grows into your skin (ingrown hair).  You may not need any treatment if your cyst does not cause symptoms. If your cyst bothers you or is infected, you may need a procedure to drain or remove the cyst.  This information is not intended to replace advice given to you by your health care provider. Make sure you discuss any questions you have with your health care provider.  Document Revised: 09/25/2021 Document Reviewed: 09/25/2021  Elsevier Patient Education  2024 [employer].

## 2023-09-28 ENCOUNTER — Encounter: Payer: Self-pay | Admitting: Physician Assistant

## 2023-09-28 NOTE — Progress Notes (Signed)
   Acute Office Visit  Subjective:     Patient ID: Carly Cooper, female    DOB: 2001/02/02, 22 y.o.   MRN: [id number]  Chief Complaint  Patient presents with   Rash    On tailbone x2days no itching just painful    HPI Patient is in today for cyst on tailbone that she has noticed for 2 days. No fever, chills, body aches. She denies any known injury. She reports having similar symptoms from time to time but never "looked down there". She noticed some "holes" in her gluteal crease and tender "lump".   .. Active Ambulatory Problems    Diagnosis Date Noted   Migraine without aura and without status migrainosus, not intractable 07/03/2020   Anxiety and depression 07/03/2020   No energy 07/03/2020   Class 2 obesity due to excess calories without serious comorbidity with body mass index (BMI) of 37.0 to 37.9 in adult 07/03/2020   Eczema 07/03/2020   Keratosis pilaris 07/03/2020   Relationship problem with parent 07/03/2020   Iron deficiency anemia secondary to inadequate dietary iron intake 07/03/2020   Vitamin D insufficiency 07/03/2020   Poor concentration 05/20/2021   Inattention 05/20/2021   Family history of attention deficit disorder 05/20/2021   Attention deficit hyperactivity disorder (ADHD), predominantly inattentive type 05/20/2021   Trouble in sleeping 12/24/2021   Right foot pain 02/26/2022   Chronic right shoulder pain 05/30/2022   Chronic pain of right knee 05/30/2022   Class 1 obesity due to excess calories without serious comorbidity with body mass index (BMI) of 33.0 to 33.9 in adult 05/30/2022   Viral respiratory illness 06/17/2022   Dysmenorrhea 11/03/2022   Frequent headaches 11/07/2022   Encounter for surveillance of contraceptive pills 03/03/2023   Encounter for initial prescription of vaginal ring hormonal contraceptive 06/02/2023   Allergic contact dermatitis due to adhesives 09/02/2023   Pilonidal cyst 09/25/2023   Resolved Ambulatory Problems     Diagnosis Date Noted   Acute pain of right shoulder 09/17/2021   Pain of right clavicle 02/26/2022   Past Medical History:  Diagnosis Date   Anxiety    Depression    Migraine      ROS See HPI.      Objective:    BP 112/83 (BP Location: Left Arm, Patient Position: Sitting, Cuff Size: Large)   Pulse 77   Ht 5\' 5"  (1.651 m)   Wt 204 lb 12 oz (92.9 kg)   SpO2 100%   BMI 34.07 kg/m  BP Readings from Last 3 Encounters:  09/25/23 112/83  09/02/23 104/73  06/02/23 115/78   Wt Readings from Last 3 Encounters:  09/25/23 204 lb 12 oz (92.9 kg)  09/02/23 201 lb 4 oz (91.3 kg)  06/02/23 205 lb (93 kg)      Physical Exam 4 small punctate holes at gluteal crease with tender pea-sized cyst at 3oclock to palpation.        Assessment & Plan:  Marland KitchenMarland KitchenIsabella was seen today for rash.  Diagnoses and all orders for this visit:  Pilonidal cyst -     doxycycline (VIBRA-TABS) 100 MG tablet; Take 1 tablet (100 mg total) by mouth 2 (two) times daily. -     Ambulatory referral to General Surgery   Findings consistent with pilonidal cyst Start doxycycline Consider removal with general surgery, referral placed today HO given  Carly Gaw, PA-C

## 2023-09-30 ENCOUNTER — Telehealth: Payer: Self-pay

## 2023-09-30 NOTE — Telephone Encounter (Signed)
 Copied from CRM [id number]. Topic: General - Other >> Sep 30, 2023  8:32 AM Gery Pray wrote: Reason for CRM: Olegario Messier from [facility] called to inform that patient has been scheduled for an office consult on Wednesday, 03/26 at 4 pm with Dr. Azucena Cecil.

## 2023-10-05 ENCOUNTER — Other Ambulatory Visit: Payer: Self-pay | Admitting: Physician Assistant

## 2023-10-05 DIAGNOSIS — G43009 Migraine without aura, not intractable, without status migrainosus: Secondary | ICD-10-CM

## 2023-10-05 DIAGNOSIS — R519 Headache, unspecified: Secondary | ICD-10-CM

## 2023-11-06 ENCOUNTER — Ambulatory Visit (INDEPENDENT_AMBULATORY_CARE_PROVIDER_SITE_OTHER): Admitting: Physician Assistant

## 2023-11-06 ENCOUNTER — Encounter: Payer: Self-pay | Admitting: Physician Assistant

## 2023-11-06 ENCOUNTER — Other Ambulatory Visit: Payer: Self-pay | Admitting: Physician Assistant

## 2023-11-06 VITALS — BP 118/75 | HR 104 | Temp 100.4°F | Ht 65.0 in | Wt 204.0 lb

## 2023-11-06 DIAGNOSIS — R6889 Other general symptoms and signs: Secondary | ICD-10-CM

## 2023-11-06 DIAGNOSIS — J0140 Acute pansinusitis, unspecified: Secondary | ICD-10-CM | POA: Diagnosis not present

## 2023-11-06 DIAGNOSIS — L0591 Pilonidal cyst without abscess: Secondary | ICD-10-CM

## 2023-11-06 LAB — POC COVID19 BINAXNOW: SARS Coronavirus 2 Ag: NEGATIVE

## 2023-11-06 LAB — POCT INFLUENZA A/B
Influenza A, POC: NEGATIVE
Influenza B, POC: NEGATIVE

## 2023-11-06 LAB — POCT RAPID STREP A (OFFICE): Rapid Strep A Screen: NEGATIVE

## 2023-11-06 MED ORDER — DOXYCYCLINE HYCLATE 100 MG PO TABS: 100.0000 mg | ORAL_TABLET | Freq: Two times a day (BID) | ORAL | 0 refills | Status: DC

## 2023-11-06 MED ORDER — FLUTICASONE PROPIONATE 50 MCG/ACT NA SUSP: 2.0000 | Freq: Every day | NASAL | 0 refills | Status: DC

## 2023-11-06 NOTE — Patient Instructions (Signed)
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
## 2023-11-06 NOTE — Telephone Encounter (Signed)
 Copied from CRM [id number]. Topic: Clinical - Prescription Issue >> Nov 06, 2023 10:00 AM Tisa Forester wrote: Reason for CRM: patient checking on status of the 2 medication from her visit with Sandy Crumb  , patient reachout to the pharmacy and they do not have the medication prescription as of yet   fluticasone (FLONASE) 50 MCG/ACT nasal spray,doxycycline  (VIBRA -TABS) 100 MG tablet Patient callback [phone]  [facility] [id number] - [place], [place] - [address] [place] [address] Phone: [phone] Fax: [phone] Hours: Not open 24 hours

## 2023-11-09 ENCOUNTER — Encounter: Payer: Self-pay | Admitting: Physician Assistant

## 2023-11-30 ENCOUNTER — Ambulatory Visit: Payer: Medicaid Other | Admitting: Physician Assistant

## 2023-12-03 ENCOUNTER — Other Ambulatory Visit: Payer: Self-pay | Admitting: Physician Assistant

## 2023-12-03 DIAGNOSIS — J0140 Acute pansinusitis, unspecified: Secondary | ICD-10-CM

## 2024-02-17 ENCOUNTER — Ambulatory Visit

## 2024-02-17 ENCOUNTER — Ambulatory Visit (INDEPENDENT_AMBULATORY_CARE_PROVIDER_SITE_OTHER): Admitting: Physician Assistant

## 2024-02-17 ENCOUNTER — Ambulatory Visit: Payer: Self-pay | Admitting: Physician Assistant

## 2024-02-17 DIAGNOSIS — M4317 Spondylolisthesis, lumbosacral region: Secondary | ICD-10-CM

## 2024-02-17 DIAGNOSIS — M549 Dorsalgia, unspecified: Secondary | ICD-10-CM | POA: Diagnosis not present

## 2024-02-17 DIAGNOSIS — M545 Low back pain, unspecified: Secondary | ICD-10-CM | POA: Diagnosis not present

## 2024-02-17 DIAGNOSIS — M5441 Lumbago with sciatica, right side: Secondary | ICD-10-CM | POA: Insufficient documentation

## 2024-02-17 MED ORDER — PREDNISONE 50 MG PO TABS: ORAL_TABLET | ORAL | 0 refills | Status: DC

## 2024-02-17 MED ORDER — LIDOCAINE 5 % EX PTCH: 1.0000 | MEDICATED_PATCH | Freq: Two times a day (BID) | CUTANEOUS | 0 refills | Status: DC

## 2024-02-17 MED ORDER — TRAMADOL HCL 50 MG PO TABS: 50.0000 mg | ORAL_TABLET | Freq: Four times a day (QID) | ORAL | 0 refills | Status: AC | PRN
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
## 2024-02-17 NOTE — Progress Notes (Signed)
 Carly Cooper,   You do have evidence that your L5/S1 vertebre did slip out a little. If pain continues I do suggest physical therapy formally to help with pain and alignment.

## 2024-02-17 NOTE — Patient Instructions (Signed)
 Get lumbar xray today Prednisone  burst for 5 days Tramadol  for break through pain Lidocaine  patches as needed Use heating pad Stretch at least twice a day Consider massage therapy  Low Back Sprain or Strain Rehab Ask your health care provider which exercises are safe for you. Do exercises exactly as told by your health care provider and adjust them as directed. It is normal to feel mild stretching, pulling, tightness, or discomfort as you do these exercises. Stop right away if you feel sudden pain or your pain gets worse. Do not begin these exercises until told by your health care provider. Stretching and range-of-motion exercises These exercises warm up your muscles and joints and improve the movement and flexibility of your back. These exercises also help to relieve pain, numbness, and tingling. Lumbar rotation  Lie on your back on a firm bed or the floor with your knees bent. Straighten your arms out to your sides so each arm forms a 90-degree angle (right angle) with a side of your body. Slowly move (rotate) both of your knees to one side of your body until you feel a stretch in your lower back (lumbar). Try not to let your shoulders lift off the floor. Hold this position for __________ seconds. Tense your abdominal muscles and slowly move your knees back to the starting position. Repeat this exercise on the other side of your body. Repeat __________ times. Complete this exercise __________ times a day. Single knee to chest  Lie on your back on a firm bed or the floor with both legs straight. Bend one of your knees. Use your hands to move your knee up toward your chest until you feel a gentle stretch in your lower back and buttock. Hold your leg in this position by holding on to the front of your knee. Keep your other leg as straight as possible. Hold this position for __________ seconds. Slowly return to the starting position. Repeat with your other leg. Repeat __________ times.  Complete this exercise __________ times a day. Prone extension on elbows  Lie on your abdomen on a firm bed or the floor (prone position). Prop yourself up on your elbows. Use your arms to help lift your chest up until you feel a gentle stretch in your abdomen and your lower back. This will place some of your body weight on your elbows. If this is uncomfortable, try stacking pillows under your chest. Your hips should stay down, against the surface that you are lying on. Keep your hip and back muscles relaxed. Hold this position for __________ seconds. Slowly relax your upper body and return to the starting position. Repeat __________ times. Complete this exercise __________ times a day. Strengthening exercises These exercises build strength and endurance in your back. Endurance is the ability to use your muscles for a long time, even after they get tired. Pelvic tilt This exercise strengthens the muscles that lie deep in the abdomen. Lie on your back on a firm bed or the floor with your legs extended. Bend your knees so they are pointing toward the ceiling and your feet are flat on the floor. Tighten your lower abdominal muscles to press your lower back against the floor. This motion will tilt your pelvis so your tailbone points up toward the ceiling instead of pointing to your feet or the floor. To help with this exercise, you may place a small towel under your lower back and try to push your back into the towel. Hold this position for __________  seconds. Let your muscles relax completely before you repeat this exercise. Repeat __________ times. Complete this exercise __________ times a day. Alternating arm and leg raises  Get on your hands and knees on a firm surface. If you are on a hard floor, you may want to use padding, such as an exercise mat, to cushion your knees. Line up your arms and legs. Your hands should be directly below your shoulders, and your knees should be directly below  your hips. Lift your left leg behind you. At the same time, raise your right arm and straighten it in front of you. Do not lift your leg higher than your hip. Do not lift your arm higher than your shoulder. Keep your abdominal and back muscles tight. Keep your hips facing the ground. Do not arch your back. Keep your balance carefully, and do not hold your breath. Hold this position for __________ seconds. Slowly return to the starting position. Repeat with your right leg and your left arm. Repeat __________ times. Complete this exercise __________ times a day. Abdominal set with straight leg raise  Lie on your back on a firm bed or the floor. Bend one of your knees and keep your other leg straight. Tense your abdominal muscles and lift your straight leg up, 4-6 inches (10-15 cm) off the ground. Keep your abdominal muscles tight and hold this position for __________ seconds. Do not hold your breath. Do not arch your back. Keep it flat against the ground. Keep your abdominal muscles tense as you slowly lower your leg back to the starting position. Repeat with your other leg. Repeat __________ times. Complete this exercise __________ times a day. Single leg lower with bent knees Lie on your back on a firm bed or the floor. Tense your abdominal muscles and lift your feet off the floor, one foot at a time, so your knees and hips are bent in 90-degree angles (right angles). Your knees should be over your hips and your lower legs should be parallel to the floor. Keeping your abdominal muscles tense and your knee bent, slowly lower one of your legs so your toe touches the ground. Lift your leg back up to return to the starting position. Do not hold your breath. Do not let your back arch. Keep your back flat against the ground. Repeat with your other leg. Repeat __________ times. Complete this exercise __________ times a day. Posture and body mechanics Good posture and healthy body mechanics  can help to relieve stress in your body's tissues and joints. Body mechanics refers to the movements and positions of your body while you do your daily activities. Posture is part of body mechanics. Good posture means: Your spine is in its natural S-curve position (neutral). Your shoulders are pulled back slightly. Your head is not tipped forward (neutral). Follow these guidelines to improve your posture and body mechanics in your everyday activities. Standing  When standing, keep your spine neutral and your feet about hip-width apart. Keep a slight bend in your knees. Your ears, shoulders, and hips should line up. When you do a task in which you stand in one place for a long time, place one foot up on a stable object that is 2-4 inches (5-10 cm) high, such as a footstool. This helps keep your spine neutral. Sitting  When sitting, keep your spine neutral and keep your feet flat on the floor. Use a footrest, if necessary, and keep your thighs parallel to the floor. Avoid rounding your shoulders,  and avoid tilting your head forward. When working at a desk or a computer, keep your desk at a height where your hands are slightly lower than your elbows. Slide your chair under your desk so you are close enough to maintain good posture. When working at a computer, place your monitor at a height where you are looking straight ahead and you do not have to tilt your head forward or downward to look at the screen. Resting When lying down and resting, avoid positions that are most painful for you. If you have pain with activities such as sitting, bending, stooping, or squatting, lie in a position in which your body does not bend very much. For example, avoid curling up on your side with your arms and knees near your chest (fetal position). If you have pain with activities such as standing for a long time or reaching with your arms, lie with your spine in a neutral position and bend your knees slightly. Try the  following positions: Lying on your side with a pillow between your knees. Lying on your back with a pillow under your knees. Lifting  When lifting objects, keep your feet at least shoulder-width apart and tighten your abdominal muscles. Bend your knees and hips and keep your spine neutral. It is important to lift using the strength of your legs, not your back. Do not lock your knees straight out. Always ask for help to lift heavy or awkward objects. This information is not intended to replace advice given to you by your health care provider. Make sure you discuss any questions you have with your health care provider. Document Revised: 11/03/2022 Document Reviewed: 09/17/2020 Elsevier Patient Education  2024 [employer].

## 2024-02-19 ENCOUNTER — Encounter: Payer: Self-pay | Admitting: Physician Assistant

## 2024-02-19 DIAGNOSIS — M4317 Spondylolisthesis, lumbosacral region: Secondary | ICD-10-CM | POA: Insufficient documentation

## 2024-03-02 ENCOUNTER — Ambulatory Visit (INDEPENDENT_AMBULATORY_CARE_PROVIDER_SITE_OTHER): Admitting: Physician Assistant

## 2024-03-02 ENCOUNTER — Encounter: Payer: Self-pay | Admitting: Physician Assistant

## 2024-03-02 VITALS — BP 122/76 | HR 80 | Resp 18 | Ht 65.0 in | Wt 223.2 lb

## 2024-03-02 DIAGNOSIS — F900 Attention-deficit hyperactivity disorder, predominantly inattentive type: Secondary | ICD-10-CM

## 2024-03-02 DIAGNOSIS — M4317 Spondylolisthesis, lumbosacral region: Secondary | ICD-10-CM | POA: Diagnosis not present

## 2024-03-02 DIAGNOSIS — M5442 Lumbago with sciatica, left side: Secondary | ICD-10-CM

## 2024-03-02 DIAGNOSIS — M5441 Lumbago with sciatica, right side: Secondary | ICD-10-CM | POA: Diagnosis not present

## 2024-03-02 MED ORDER — VYVANSE 40 MG PO CAPS: 40.0000 mg | ORAL_CAPSULE | ORAL | 0 refills | Status: AC

## 2024-03-02 MED ORDER — TRAMADOL HCL 50 MG PO TABS: ORAL_TABLET | ORAL | 0 refills | Status: DC

## 2024-03-02 MED ORDER — IBUPROFEN 800 MG PO TABS
800.0000 mg | ORAL_TABLET | Freq: Three times a day (TID) | ORAL | 1 refills | Status: AC | PRN
Start: 2024-03-02 — End: ?

## 2024-03-02 NOTE — Progress Notes (Unsigned)
   Established Patient Office Visit  Subjective   Patient ID: Carly Cooper, female    DOB: Sep 03, 2000  Age: 23 y.o. MRN: [id number]  No chief complaint on file.   HPI  {History (Optional):23778}  ROS    Objective:     LMP 02/05/2024 (Approximate)  {Vitals History (Optional):23777}  Physical Exam   No results found for any visits on 03/02/24.  {Labs (Optional):23779}  The ASCVD Risk score (Arnett DK, et al., 2019) failed to calculate for the following reasons:   The 2019 ASCVD risk score is only valid for ages 2 to 76    Assessment & Plan:   Problem List Items Addressed This Visit   None   No follow-ups on file.    Toyia Jelinek, PA-C

## 2024-03-04 ENCOUNTER — Encounter: Payer: Self-pay | Admitting: Physician Assistant

## 2024-03-04 NOTE — Patient Instructions (Signed)
Formal PT ordered

## 2024-03-09 ENCOUNTER — Encounter: Payer: Self-pay | Admitting: Physical Therapy

## 2024-03-09 ENCOUNTER — Other Ambulatory Visit: Payer: Self-pay

## 2024-03-09 ENCOUNTER — Ambulatory Visit: Attending: Physician Assistant | Admitting: Physical Therapy

## 2024-03-09 DIAGNOSIS — M5442 Lumbago with sciatica, left side: Secondary | ICD-10-CM | POA: Insufficient documentation

## 2024-03-09 DIAGNOSIS — M6281 Muscle weakness (generalized): Secondary | ICD-10-CM | POA: Insufficient documentation

## 2024-03-09 DIAGNOSIS — M5459 Other low back pain: Secondary | ICD-10-CM

## 2024-03-09 DIAGNOSIS — M4317 Spondylolisthesis, lumbosacral region: Secondary | ICD-10-CM | POA: Insufficient documentation

## 2024-03-09 DIAGNOSIS — M5441 Lumbago with sciatica, right side: Secondary | ICD-10-CM | POA: Insufficient documentation

## 2024-03-09 DIAGNOSIS — R2689 Other abnormalities of gait and mobility: Secondary | ICD-10-CM | POA: Insufficient documentation
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
## 2024-03-11 ENCOUNTER — Ambulatory Visit: Admitting: Physical Therapy

## 2024-03-11 ENCOUNTER — Encounter: Payer: Self-pay | Admitting: Physical Therapy

## 2024-03-11 DIAGNOSIS — M6281 Muscle weakness (generalized): Secondary | ICD-10-CM

## 2024-03-11 DIAGNOSIS — R2689 Other abnormalities of gait and mobility: Secondary | ICD-10-CM

## 2024-03-11 DIAGNOSIS — M5459 Other low back pain: Secondary | ICD-10-CM

## 2024-03-15 ENCOUNTER — Encounter: Payer: Self-pay | Admitting: Sports Medicine
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
## 2024-03-17 ENCOUNTER — Ambulatory Visit: Attending: Physician Assistant | Admitting: Physical Therapy

## 2024-03-17 ENCOUNTER — Encounter: Payer: Self-pay | Admitting: Physical Therapy

## 2024-03-17 DIAGNOSIS — M6281 Muscle weakness (generalized): Secondary | ICD-10-CM | POA: Diagnosis present

## 2024-03-17 DIAGNOSIS — R2689 Other abnormalities of gait and mobility: Secondary | ICD-10-CM | POA: Diagnosis present

## 2024-03-17 DIAGNOSIS — M5459 Other low back pain: Secondary | ICD-10-CM | POA: Insufficient documentation

## 2024-03-21 ENCOUNTER — Ambulatory Visit
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
## 2024-03-24 ENCOUNTER — Ambulatory Visit: Admitting: Physical Therapy

## 2024-03-24 ENCOUNTER — Encounter: Payer: Self-pay | Admitting: Physical Therapy

## 2024-03-24 DIAGNOSIS — M5459 Other low back pain: Secondary | ICD-10-CM

## 2024-03-24 DIAGNOSIS — R2689 Other abnormalities of gait and mobility: Secondary | ICD-10-CM

## 2024-03-24 DIAGNOSIS — M6281 Muscle weakness (generalized): Secondary | ICD-10-CM
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
## 2024-03-28 ENCOUNTER — Ambulatory Visit: Admitting: Physical Therapy

## 2024-03-28 ENCOUNTER — Encounter: Payer: Self-pay | Admitting: Physical Therapy

## 2024-03-28 DIAGNOSIS — R2689 Other abnormalities of gait and mobility: Secondary | ICD-10-CM

## 2024-03-28 DIAGNOSIS — M5459 Other low back pain: Secondary | ICD-10-CM

## 2024-03-28 DIAGNOSIS — M6281 Muscle weakness (generalized): Secondary | ICD-10-CM

## 2024-03-30 ENCOUNTER — Encounter: Payer: Self-pay | Admitting: Physical Therapy

## 2024-03-30 ENCOUNTER — Ambulatory Visit: Admitting: Physical Therapy

## 2024-03-30 DIAGNOSIS — R2689 Other abnormalities of gait and mobility: Secondary | ICD-10-CM

## 2024-03-30 DIAGNOSIS — M6281 Muscle weakness (generalized): Secondary | ICD-10-CM

## 2024-03-30 DIAGNOSIS — M5459 Other low back pain: Secondary | ICD-10-CM | POA: Diagnosis not present
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
## 2024-04-04 ENCOUNTER — Encounter: Payer: Self-pay | Admitting: Physical Therapy

## 2024-04-04 ENCOUNTER — Ambulatory Visit: Admitting: Physical Therapy

## 2024-04-04 DIAGNOSIS — M5459 Other low back pain: Secondary | ICD-10-CM

## 2024-04-04 DIAGNOSIS — R2689 Other abnormalities of gait and mobility: Secondary | ICD-10-CM

## 2024-04-04 DIAGNOSIS — M6281 Muscle weakness (generalized): Secondary | ICD-10-CM
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
## 2024-04-06 ENCOUNTER — Ambulatory Visit: Admitting: Physical Therapy

## 2024-04-06 ENCOUNTER — Encounter: Payer: Self-pay | Admitting: Physical Therapy

## 2024-04-06 DIAGNOSIS — R2689 Other abnormalities of gait and mobility: Secondary | ICD-10-CM

## 2024-04-06 DIAGNOSIS — M5459 Other low back pain: Secondary | ICD-10-CM

## 2024-04-06 DIAGNOSIS — M6281 Muscle weakness (generalized): Secondary | ICD-10-CM
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
## 2024-04-11 ENCOUNTER — Ambulatory Visit (INDEPENDENT_AMBULATORY_CARE_PROVIDER_SITE_OTHER): Admitting: Physician Assistant

## 2024-04-11 ENCOUNTER — Ambulatory Visit: Admitting: Physical Therapy

## 2024-04-11 ENCOUNTER — Encounter: Payer: Self-pay | Admitting: Physician Assistant

## 2024-04-11 VITALS — BP 124/74 | HR 71 | Ht 65.0 in | Wt 221.0 lb

## 2024-04-11 DIAGNOSIS — F32A Depression, unspecified: Secondary | ICD-10-CM

## 2024-04-11 DIAGNOSIS — N3001 Acute cystitis with hematuria: Secondary | ICD-10-CM | POA: Insufficient documentation

## 2024-04-11 DIAGNOSIS — F419 Anxiety disorder, unspecified: Secondary | ICD-10-CM | POA: Diagnosis not present

## 2024-04-11 MED ORDER — DESVENLAFAXINE SUCCINATE ER 100 MG PO TB24
100.0000 mg | ORAL_TABLET | Freq: Every day | ORAL | 3 refills | Status: AC
Start: 2024-04-11 — End: ?

## 2024-04-11 NOTE — Patient Instructions (Addendum)
 Finish macrobid antibiotic.   Urinary Tract Infection, Female A urinary tract infection (UTI) is an infection in your urinary tract. The urinary tract is made up of organs that make, store, and get rid of pee (urine) in your body. These organs include: The kidneys. The ureters. The bladder. The urethra. What are the causes? Most UTIs are caused by germs called bacteria. They may be in or near your genitals. These germs grow and cause swelling in your urinary tract. What increases the risk? You're more likely to get a UTI if: You're a female. The urethra is shorter in females than in males. You have a soft tube called a catheter that drains your pee. You can't control when you pee or poop. You have trouble peeing because of: A kidney stone. A urinary blockage. A nerve condition that affects your bladder. Not getting enough to drink. You're sexually active. You use a birth control inside your vagina, like spermicide. You're pregnant. You have low levels of the hormone estrogen in your body. You're an older adult. You're also more likely to get a UTI if you have other health problems. These may include: Diabetes. A weak immune system. Your immune system is your body's defense system. Sickle cell disease. Injury of the spine. What are the signs or symptoms? Symptoms may include: Needing to pee right away. Peeing small amounts often. Pain or burning when you pee. Blood in your pee. Pee that smells bad or odd. Pain in your belly or lower back. You may also: Feel confused. This may be the first symptom in older adults. Vomit. Not feel hungry. Feel tired or easily annoyed. Have a fever or chills. How is this diagnosed? A UTI is diagnosed based on your medical history and an exam. You may also have other tests. These may include: Pee tests. Blood tests. Tests for sexually transmitted infections (STIs). If you've had more than one UTI, you may need to have imaging studies done  to find out why you keep getting them. How is this treated? A UTI can be treated by: Taking antibiotics or other medicines. Drinking enough fluid to keep your pee pale yellow. In rare cases, a UTI can cause a very bad condition called sepsis. Sepsis may be treated in the hospital. Follow these instructions at home: Medicines Take your medicines only as told by your health care provider. If you were given antibiotics, take them as told by your provider. Do not stop taking them even if you start to feel better. General instructions Make sure you: Pee often and fully. Do not hold your pee for a long time. Wipe from front to back after you pee or poop. Use each tissue only once when you wipe. Pee after you have sex. Do not douche or use sprays or powders in your genital area. Contact a health care provider if: Your symptoms don't get better after 1-2 days of taking antibiotics. Your symptoms go away and then come back. You have a fever or chills. You vomit or feel like you may vomit. Get help right away if: You have very bad pain in your back or lower belly. You faint. This information is not intended to replace advice given to you by your health care provider. Make sure you discuss any questions you have with your health care provider. Document Revised: 06/10/2023 Document Reviewed: 10/03/2022 Elsevier Patient Education  2025 [employer].

## 2024-04-11 NOTE — Progress Notes (Signed)
 "  Established Patient Office Visit  Subjective   Patient ID: Carly Cooper, female    DOB: August 26, 2000  Age: 23 y.o. MRN: [id number]  Chief Complaint  Patient presents with   Dysuria    HPI Discussed the use of AI scribe software for clinical note transcription with the patient, who gave verbal consent to proceed.  History of Present Illness Carly Cooper is a 23 year old female who presents for follow-up of a diagnosed urinary tract infection (UTI).   Lower urinary tract symptoms - Diagnosed with urinary tract infection (UTI) at urgent care yesterday - Initiated Macrobid therapy, with two doses taken so far - Dysuria began five days ago - Urine dipstick positive for leukocytes and blood, negative for protein and nitrites - No urine culture performed  Flank and abdominal pain - Back pain began five days ago, improved with ibuprofen  - No recurrence of back pain without further ibuprofen  use - Cramps present this morning - No fever - Completed menstrual period last Friday  Preventive and dietary measures - Taking cranberry pills for UTI prevention - No new foods or drinks introduced recently, except for an energy drink two weeks ago  She also mentions worsening anxiety. She admits she is not taking pristiq . She has an animal that she needs a note for in apartment for emotional support. This significantly helps her anxiety symptoms.    ROS See HPI>    Objective:     BP 124/74   Pulse 71   Ht 5' 5 (1.651 m)   Wt 221 lb (100.2 kg)   SpO2 99%   BMI 36.78 kg/m  BP Readings from Last 3 Encounters:  04/11/24 124/74  03/02/24 122/76  02/17/24 (!) 147/75   Wt Readings from Last 3 Encounters:  04/11/24 221 lb (100.2 kg)  03/02/24 223 lb 4 oz (101.3 kg)  11/06/23 204 lb (92.5 kg)      Physical Exam Constitutional:      Appearance: Normal appearance.  HENT:     Head: Normocephalic.  Cardiovascular:     Rate and Rhythm: Normal rate and regular rhythm.   Pulmonary:     Effort: Pulmonary effort is normal.     Breath sounds: Normal breath sounds.  Abdominal:     General: There is no distension.     Palpations: Abdomen is soft.     Tenderness: There is no abdominal tenderness. There is no right CVA tenderness, left CVA tenderness, guarding or rebound.  Neurological:     General: No focal deficit present.     Mental Status: She is alert and oriented to person, place, and time.  Psychiatric:        Mood and Affect: Mood normal.          Assessment & Plan:  .Tenia was seen today for dysuria.  Diagnoses and all orders for this visit:  Acute cystitis with hematuria -     POCT URINALYSIS DIP (CLINITEK) -     Urine Culture  Anxiety and depression -     desvenlafaxine  (PRISTIQ ) 100 MG 24 hr tablet; Take 1 tablet (100 mg total) by mouth daily.   Assessment & Plan Urinary tract infection Diagnosed with UTI. Symptoms improving with antibiotics, likely E. coli infection. No red flags on exam today.  - Continue Macrobid as prescribed. - Obtain urine sample for culture. - Encourage cranberry juice consumption for UTI prevention.  Anxiety and depression Discussed restarting Pristiq  to assess baseline symptoms. Considered emotional support animal for anxiety  management. - Restart Pristiq  for anxiety and depression. - Provide documentation for emotional support animal upon receiving necessary paperwork.    Return if symptoms worsen or fail to improve.    Haneefah Venturini, PA-C  "

## 2024-04-12 ENCOUNTER — Encounter: Payer: Self-pay | Admitting: Physician Assistant
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
## 2024-04-13 ENCOUNTER — Ambulatory Visit: Payer: Self-pay | Admitting: Physician Assistant

## 2024-04-13 ENCOUNTER — Ambulatory Visit: Attending: Physician Assistant | Admitting: Physical Therapy

## 2024-04-13 ENCOUNTER — Encounter: Payer: Self-pay | Admitting: Physician Assistant

## 2024-04-13 ENCOUNTER — Encounter: Payer: Self-pay | Admitting: Physical Therapy

## 2024-04-13 DIAGNOSIS — M6281 Muscle weakness (generalized): Secondary | ICD-10-CM | POA: Insufficient documentation

## 2024-04-13 DIAGNOSIS — R2689 Other abnormalities of gait and mobility: Secondary | ICD-10-CM | POA: Insufficient documentation

## 2024-04-13 DIAGNOSIS — M5459 Other low back pain: Secondary | ICD-10-CM | POA: Diagnosis present

## 2024-04-13 LAB — URINE CULTURE

## 2024-04-13 NOTE — Progress Notes (Signed)
 Urine culture showed no significant bacteria count. Great news. I would complete the macrobid that you started.

## 2024-04-18 ENCOUNTER — Telehealth: Payer: Self-pay | Admitting: Physical Therapy

## 2024-04-18 ENCOUNTER — Ambulatory Visit: Admitting: Physical Therapy
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
## 2024-04-18 NOTE — Telephone Encounter (Signed)
 Called pt re: today's missed appt - left voicemail w/ date/time of next appt, provided office call back number and encouraged to reach out with any questions/concerns.

## 2024-04-20 ENCOUNTER — Encounter: Payer: Self-pay | Admitting: Physical Therapy

## 2024-04-20 ENCOUNTER — Ambulatory Visit: Admitting: Physical Therapy

## 2024-04-20 DIAGNOSIS — M5459 Other low back pain: Secondary | ICD-10-CM

## 2024-04-20 DIAGNOSIS — M6281 Muscle weakness (generalized): Secondary | ICD-10-CM

## 2024-04-20 DIAGNOSIS — R2689 Other abnormalities of gait and mobility: Secondary | ICD-10-CM
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
## 2024-04-25 ENCOUNTER — Ambulatory Visit: Admitting: Physical Therapy

## 2024-04-25 ENCOUNTER — Encounter: Payer: Self-pay | Admitting: Physical Therapy

## 2024-04-25 DIAGNOSIS — M5459 Other low back pain: Secondary | ICD-10-CM

## 2024-04-25 DIAGNOSIS — R2689 Other abnormalities of gait and mobility: Secondary | ICD-10-CM

## 2024-04-25 DIAGNOSIS — M6281 Muscle weakness (generalized): Secondary | ICD-10-CM
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
## 2024-04-27 ENCOUNTER — Ambulatory Visit: Admitting: Physical Therapy
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
## 2024-05-02 ENCOUNTER — Ambulatory Visit: Admitting: Physical Therapy

## 2024-05-02 ENCOUNTER — Encounter: Payer: Self-pay | Admitting: Physical Therapy

## 2024-05-02 DIAGNOSIS — M5459 Other low back pain: Secondary | ICD-10-CM | POA: Diagnosis not present

## 2024-05-02 DIAGNOSIS — R2689 Other abnormalities of gait and mobility: Secondary | ICD-10-CM

## 2024-05-02 DIAGNOSIS — M6281 Muscle weakness (generalized): Secondary | ICD-10-CM

## 2024-05-03 ENCOUNTER — Ambulatory Visit: Admitting: Physician Assistant

## 2024-05-04 ENCOUNTER — Ambulatory Visit: Admitting: Physical Therapy

## 2024-05-04 ENCOUNTER — Encounter: Payer: Self-pay | Admitting: Physical Therapy

## 2024-05-04 DIAGNOSIS — M6281 Muscle weakness (generalized): Secondary | ICD-10-CM

## 2024-05-04 DIAGNOSIS — M5459 Other low back pain: Secondary | ICD-10-CM | POA: Diagnosis not present

## 2024-05-04 DIAGNOSIS — R2689 Other abnormalities of gait and mobility: Secondary | ICD-10-CM
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
## 2024-05-09 ENCOUNTER — Encounter: Payer: Self-pay | Admitting: Physical Therapy

## 2024-05-09 ENCOUNTER — Ambulatory Visit (INDEPENDENT_AMBULATORY_CARE_PROVIDER_SITE_OTHER): Admitting: Physical Therapy

## 2024-05-09 DIAGNOSIS — R2689 Other abnormalities of gait and mobility: Secondary | ICD-10-CM

## 2024-05-09 DIAGNOSIS — M6281 Muscle weakness (generalized): Secondary | ICD-10-CM

## 2024-05-09 DIAGNOSIS — M5459 Other low back pain: Secondary | ICD-10-CM | POA: Diagnosis not present
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
## 2024-05-11 ENCOUNTER — Encounter: Payer: Self-pay | Admitting: Physical Therapy

## 2024-05-11 ENCOUNTER — Ambulatory Visit: Admitting: Physical Therapy

## 2024-05-11 DIAGNOSIS — M5459 Other low back pain: Secondary | ICD-10-CM

## 2024-05-11 DIAGNOSIS — M6281 Muscle weakness (generalized): Secondary | ICD-10-CM

## 2024-05-11 DIAGNOSIS — R2689 Other abnormalities of gait and mobility: Secondary | ICD-10-CM
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
## 2024-05-13 ENCOUNTER — Ambulatory Visit: Admitting: Physician Assistant

## 2024-05-16 ENCOUNTER — Encounter: Payer: Self-pay | Admitting: Physical Therapy

## 2024-05-16 ENCOUNTER — Ambulatory Visit: Attending: Physician Assistant | Admitting: Physical Therapy

## 2024-05-16 DIAGNOSIS — M6281 Muscle weakness (generalized): Secondary | ICD-10-CM | POA: Insufficient documentation

## 2024-05-16 DIAGNOSIS — R2689 Other abnormalities of gait and mobility: Secondary | ICD-10-CM | POA: Diagnosis present

## 2024-05-16 DIAGNOSIS — M5459 Other low back pain: Secondary | ICD-10-CM | POA: Insufficient documentation
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
## 2024-05-17 ENCOUNTER — Ambulatory Visit: Admitting: Physician Assistant

## 2024-05-17 ENCOUNTER — Ambulatory Visit: Admitting: Obstetrics and Gynecology

## 2024-05-17 ENCOUNTER — Encounter: Payer: Self-pay | Admitting: Obstetrics and Gynecology

## 2024-05-17 ENCOUNTER — Other Ambulatory Visit (HOSPITAL_COMMUNITY)
Admission: RE | Admit: 2024-05-17 | Discharge: 2024-05-17 | Disposition: A | Discharge status: Home / Self Care | Source: Ambulatory Visit | Attending: Obstetrics and Gynecology | Admitting: Obstetrics and Gynecology

## 2024-05-17 ENCOUNTER — Encounter: Payer: Self-pay | Admitting: Physician Assistant

## 2024-05-17 VITALS — BP 133/82 | HR 80 | Ht 65.0 in | Wt 232.0 lb

## 2024-05-17 VITALS — BP 116/71 | HR 77 | Ht 65.0 in | Wt 236.0 lb

## 2024-05-17 DIAGNOSIS — M4317 Spondylolisthesis, lumbosacral region: Secondary | ICD-10-CM

## 2024-05-17 DIAGNOSIS — Z23 Encounter for immunization: Secondary | ICD-10-CM | POA: Diagnosis not present

## 2024-05-17 DIAGNOSIS — J310 Chronic rhinitis: Secondary | ICD-10-CM

## 2024-05-17 DIAGNOSIS — G43009 Migraine without aura, not intractable, without status migrainosus: Secondary | ICD-10-CM

## 2024-05-17 DIAGNOSIS — Z0000 Encounter for general adult medical examination without abnormal findings: Secondary | ICD-10-CM

## 2024-05-17 DIAGNOSIS — M5441 Lumbago with sciatica, right side: Secondary | ICD-10-CM

## 2024-05-17 DIAGNOSIS — Z01419 Encounter for gynecological examination (general) (routine) without abnormal findings: Secondary | ICD-10-CM | POA: Diagnosis present

## 2024-05-17 DIAGNOSIS — J0140 Acute pansinusitis, unspecified: Secondary | ICD-10-CM | POA: Insufficient documentation

## 2024-05-17 DIAGNOSIS — M5442 Lumbago with sciatica, left side: Secondary | ICD-10-CM | POA: Diagnosis not present

## 2024-05-17 DIAGNOSIS — G8929 Other chronic pain: Secondary | ICD-10-CM

## 2024-05-17 MED ORDER — TRAMADOL HCL 50 MG PO TABS
ORAL_TABLET | ORAL | 0 refills | Status: AC
Start: 2024-05-17 — End: ?

## 2024-05-17 MED ORDER — ONDANSETRON 8 MG PO TBDP: 8.0000 mg | ORAL_TABLET | Freq: Three times a day (TID) | ORAL | 1 refills | Status: AC | PRN

## 2024-05-17 MED ORDER — FLUTICASONE PROPIONATE 50 MCG/ACT NA SUSP: 2.0000 | Freq: Every day | NASAL | 0 refills | Status: AC

## 2024-05-17 MED ORDER — ALPRAZOLAM 1 MG PO TABS: 1.0000 mg | ORAL_TABLET | Freq: Once | ORAL | 0 refills | Status: AC

## 2024-05-17 NOTE — Progress Notes (Signed)
 Denies problems today. Wants swab check for infection today. GAD 3 PHQ 4

## 2024-05-17 NOTE — Progress Notes (Signed)
 GYNECOLOGY ANNUAL PREVENTATIVE CARE ENCOUNTER NOTE  History:     Carly Cooper is a 23 y.o. G0P0000 female here for a routine annual gynecologic exam.  Current complaints: None.   Denies abnormal vaginal bleeding, discharge, pelvic pain, problems with intercourse or other gynecologic concerns.  Interested in BC,specifically an IUD due to heavy, painful periods   Gynecologic History Patient's last menstrual period was 05/06/2024. Contraception: none Last Pap: NA age.  Last Mammogram: NA  Obstetric History OB History  Gravida Para Term Preterm AB Living  0 0 0 0 0 0  SAB IAB Ectopic Multiple Live Births  0 0 0 0 0    Past Medical History:  Diagnosis Date   Anxiety    Depression    Migraine     Past Surgical History:  Procedure Laterality Date   TONSILLECTOMY  2015   WISDOM TOOTH EXTRACTION  2020    Current Outpatient Medications on File Prior to Visit  Medication Sig Dispense Refill   cholecalciferol (VITAMIN D3) 25 MCG (1000 UNIT) tablet Take 1,000 Units by mouth daily.     desvenlafaxine  (PRISTIQ ) 100 MG 24 hr tablet Take 1 tablet (100 mg total) by mouth daily. 90 tablet 3   ferrous sulfate  325 (65 FE) MG EC tablet Take 1 tablet (325 mg total) by mouth 3 (three) times daily with meals. 90 tablet 11   fluticasone  (FLONASE ) 50 MCG/ACT nasal spray SPRAY 2 SPRAYS INTO EACH NOSTRIL EVERY DAY 16 mL 0   ibuprofen  (ADVIL ) 800 MG tablet Take 1 tablet (800 mg total) by mouth every 8 (eight) hours as needed. 90 tablet 1   ondansetron  (ZOFRAN -ODT) 8 MG disintegrating tablet Take 1 tablet (8 mg total) by mouth every 8 (eight) hours as needed. 20 tablet 1   Rimegepant Sulfate (NURTEC) 75 MG TBDP Take 1 tablet (75 mg total) by mouth every other day. 15 tablet 11   VYVANSE  40 MG capsule Take 1 capsule (40 mg total) by mouth every morning. 30 capsule 0   VYVANSE  40 MG capsule Take 1 capsule (40 mg total) by mouth every morning. 30 capsule 0   VYVANSE  40 MG capsule Take 1 capsule (40  mg total) by mouth every morning. 30 capsule 0   cyclobenzaprine (FLEXERIL) 10 MG tablet Take by mouth. (Patient not taking: Reported on 05/17/2024)     etonogestrel -ethinyl estradiol  (NUVARING) 0.12-0.015 MG/24HR vaginal ring Insert vaginally and leave in place for 3 consecutive weeks, then remove for 1 week. (Patient not taking: Reported on 05/17/2024) 3 each 4   lidocaine  (LIDODERM ) 5 % Place 1 patch onto the skin every 12 (twelve) hours. Remove & Discard patch within 12 hours or as directed by MD (Patient not taking: Reported on 05/17/2024) 9 patch 0   traMADol  (ULTRAM ) 50 MG tablet Take one tablet as needed for pain up to twice a day. (Patient not taking: Reported on 05/17/2024) 60 tablet 0   triamcinolone  cream (KENALOG ) 0.1 % Apply 1 Application topically 2 (two) times daily. (Patient not taking: Reported on 05/17/2024) 45 g 0   No current facility-administered medications on file prior to visit.    Allergies  Allergen Reactions   Penicillins Rash   Amoxicillin    Macrobid [Nitrofurantoin]     nausea   Topamax  [Topiramate ]     Head full/weird feeling   Wound Dressing Adhesive     Rash/irritation    Social History:  reports that she has never smoked. She has never used smokeless tobacco. She  reports that she does not drink alcohol and does not use drugs.  History reviewed. No pertinent family history.  The following portions of the patient's history were reviewed and updated as appropriate: allergies, current medications, past family history, past medical history, past social history, past surgical history and problem list.  Review of Systems Pertinent items noted in HPI and remainder of comprehensive ROS otherwise negative.  Physical Exam:  BP 133/82   Pulse 80   Ht 5' 5 (1.651 m)   Wt 232 lb (105.2 kg)   LMP 05/06/2024   BMI 38.61 kg/m  CONSTITUTIONAL: Well-developed, well-nourished female in no acute distress.  HENT:  Normocephalic, atraumatic, External right and left ear  normal.  EYES: Conjunctivae and EOM are normal. Pupils are equal, round, and reactive to light. No scleral icterus.  NECK: Normal range of motion, supple, no masses.  Normal thyroid.  SKIN: Skin is warm and dry. No rash noted. Not diaphoretic. No erythema. No pallor. MUSCULOSKELETAL: Normal range of motion. No tenderness.  No cyanosis, clubbing, or edema. NEUROLOGIC: Alert and oriented to person, place, and time. Normal reflexes, muscle tone coordination.  PSYCHIATRIC: Normal mood and affect. Normal behavior. Normal judgment and thought content. CARDIOVASCULAR: Normal heart rate noted, regular rhythm RESPIRATORY: Clear to auscultation bilaterally. Effort and breath sounds normal, no problems with respiration noted. BREASTS: Symmetric in size. No masses, tenderness, skin changes, nipple drainage, or lymphadenopathy bilaterally. Performed in the presence of a chaperone. ABDOMEN: Soft, no distention noted.  No tenderness, rebound or guarding.  PELVIC: Normal appearing external genitalia and urethral meatus; normal appearing vaginal mucosa and cervix.  No abnormal vaginal discharge noted.  Pap smear obtained.  Normal uterine size, no other palpable masses, no uterine or adnexal tenderness.  Performed in the presence of a chaperone.   Assessment and Plan:   1. Annual physical exam (Primary)   2. Women's annual routine gynecological examination  -Desires IUD insertion. Would like something to help with anxiety prior to placement. Rx Xanax to take 1 hour prior to insertion -STI testing requested today.  -Declines blood work today.   Will follow up results of pap smear and manage accordingly. Normal breast examination today, she was advised to perform periodic self breast examinations. Please refer to After Visit Summary for other counseling recommendations.    Carly Cooper, Delon FERNS, NP [facility] [employer], [facility]

## 2024-05-17 NOTE — Assessment & Plan Note (Signed)
 Continue use of nasal spray as needed  Refill for Fluticasone  sent today

## 2024-05-17 NOTE — Assessment & Plan Note (Signed)
 We discussed the progress of PT and assessed ROM of the ankles bilaterally.  Continue working on at-home exercises to help with ROM of the back  Tramadol  refills sent as needed for severe pain  Continue use of Ibuprofen  for pain as well.  If pain or symptoms worsen, please return to the office.

## 2024-05-17 NOTE — Assessment & Plan Note (Signed)
 Continue using Zofran  as needed for nausea associated with migraines.  Refill for Zofran  has been sent.
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
## 2024-05-17 NOTE — Patient Instructions (Signed)
 Will refer to orthopedics

## 2024-05-18 ENCOUNTER — Encounter: Payer: Self-pay | Admitting: Physician Assistant

## 2024-05-18 LAB — CERVICOVAGINAL ANCILLARY ONLY
Bacterial Vaginitis (gardnerella): POSITIVE — AB
Candida Glabrata: NEGATIVE
Candida Vaginitis: NEGATIVE
Chlamydia: NEGATIVE
Comment: NEGATIVE
Comment: NEGATIVE
Comment: NEGATIVE
Comment: NEGATIVE
Comment: NEGATIVE
Comment: NORMAL
Neisseria Gonorrhea: NEGATIVE
Trichomonas: NEGATIVE

## 2024-05-20 LAB — CYTOLOGY - PAP
Comment: NEGATIVE
Diagnosis: UNDETERMINED — AB
High risk HPV: NEGATIVE

## 2024-05-23 ENCOUNTER — Ambulatory Visit: Admitting: Physical Therapy

## 2024-05-23 ENCOUNTER — Encounter: Payer: Self-pay | Admitting: Physical Therapy

## 2024-05-23 DIAGNOSIS — R2689 Other abnormalities of gait and mobility: Secondary | ICD-10-CM

## 2024-05-23 DIAGNOSIS — M5459 Other low back pain: Secondary | ICD-10-CM | POA: Diagnosis not present

## 2024-05-23 DIAGNOSIS — M6281 Muscle weakness (generalized): Secondary | ICD-10-CM
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
## 2024-05-24 ENCOUNTER — Ambulatory Visit: Payer: Self-pay | Admitting: Obstetrics and Gynecology

## 2024-05-24 ENCOUNTER — Ambulatory Visit (HOSPITAL_BASED_OUTPATIENT_CLINIC_OR_DEPARTMENT_OTHER)
Admission: RE | Admit: 2024-05-24 | Discharge: 2024-05-24 | Disposition: A | Discharge status: Home / Self Care | Source: Ambulatory Visit

## 2024-05-24 ENCOUNTER — Ambulatory Visit

## 2024-05-24 VITALS — Ht 65.0 in | Wt 230.0 lb

## 2024-05-24 DIAGNOSIS — M4306 Spondylolysis, lumbar region: Secondary | ICD-10-CM | POA: Diagnosis present

## 2024-05-24 DIAGNOSIS — M4316 Spondylolisthesis, lumbar region: Secondary | ICD-10-CM | POA: Diagnosis present

## 2024-05-24 MED ORDER — METRONIDAZOLE 500 MG PO TABS: 500.0000 mg | ORAL_TABLET | Freq: Two times a day (BID) | ORAL | 0 refills | Status: DC
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
## 2024-05-26 ENCOUNTER — Ambulatory Visit: Payer: Self-pay

## 2024-05-31 ENCOUNTER — Ambulatory Visit: Admitting: Obstetrics and Gynecology
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
## 2024-06-01 ENCOUNTER — Encounter: Payer: Self-pay | Admitting: Physical Therapy

## 2024-06-01 ENCOUNTER — Ambulatory Visit: Admitting: Physical Therapy

## 2024-06-01 DIAGNOSIS — M5459 Other low back pain: Secondary | ICD-10-CM

## 2024-06-01 DIAGNOSIS — R2689 Other abnormalities of gait and mobility: Secondary | ICD-10-CM

## 2024-06-01 DIAGNOSIS — M6281 Muscle weakness (generalized): Secondary | ICD-10-CM

## 2024-06-13 ENCOUNTER — Ambulatory Visit: Payer: Self-pay | Admitting: Physical Therapy
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
## 2024-06-16 ENCOUNTER — Encounter: Payer: Self-pay | Admitting: Physical Therapy

## 2024-06-16 ENCOUNTER — Ambulatory Visit: Admitting: Physical Therapy

## 2024-06-16 DIAGNOSIS — M6281 Muscle weakness (generalized): Secondary | ICD-10-CM | POA: Insufficient documentation

## 2024-06-16 DIAGNOSIS — M5459 Other low back pain: Secondary | ICD-10-CM | POA: Insufficient documentation

## 2024-06-16 DIAGNOSIS — R2689 Other abnormalities of gait and mobility: Secondary | ICD-10-CM | POA: Insufficient documentation
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
## 2024-06-21 ENCOUNTER — Encounter: Payer: Self-pay | Admitting: Obstetrics and Gynecology

## 2024-06-21 ENCOUNTER — Ambulatory Visit: Admitting: Obstetrics and Gynecology

## 2024-06-21 VITALS — BP 118/81 | HR 80 | Ht 65.0 in | Wt 235.0 lb

## 2024-06-21 DIAGNOSIS — Z30430 Encounter for insertion of intrauterine contraceptive device: Secondary | ICD-10-CM

## 2024-06-21 MED ORDER — LEVONORGESTREL 20 MCG/DAY IU IUD
1.0000 | INTRAUTERINE_SYSTEM | Freq: Once | INTRAUTERINE | Status: AC
  Administered 2024-06-21: 1 via INTRAUTERINE

## 2024-06-21 NOTE — Progress Notes (Signed)
 Patient present for Mirena  IUD insertion.Delon Barrio, RN
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
## 2024-06-21 NOTE — Progress Notes (Signed)
   GYNECOLOGY CLINIC PROCEDURE NOTE  Carly Cooper is a 23 y.o. G0P0000 here for Mirena  IUD insertion. No GYN concerns.  Last pap smear was on 05/2023 and was normal(ASCUS-Neg HPV)  IUD Insertion Procedure Note Patient identified, informed consent performed, consent signed.   Discussed risks of irregular bleeding, cramping, infection, malpositioning or misplacement of the IUD outside the uterus which may require further procedure such as laparoscopy. Time out was performed.  Urine pregnancy test negative.  Speculum placed in the vagina.  Cervix visualized.  Cleaned with Betadine x 2.  Grasped anteriorly with a single tooth tenaculum.  Uterus sounded to 7 cm. IUD placed per manufacturer's recommendations.  Strings trimmed to 3 cm. Tenaculum was removed, good hemostasis noted.  Patient tolerated procedure well.   Patient was given post-procedure instructions.  She was advised to have backup contraception for one week.  Patient was also asked to check IUD strings periodically and follow up as needed.  Dorita Delon FERNS, NP 06/21/2024 3:46 PM

## 2024-06-22 ENCOUNTER — Ambulatory Visit: Admitting: Physical Therapy

## 2024-07-01 ENCOUNTER — Ambulatory Visit: Admitting: Physical Therapy
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
## 2024-07-04 ENCOUNTER — Ambulatory Visit: Admitting: Physical Therapy
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
## 2024-07-05 ENCOUNTER — Ambulatory Visit: Admitting: Physical Therapy

## 2024-07-05 ENCOUNTER — Encounter: Payer: Self-pay | Admitting: Physical Therapy

## 2024-07-05 DIAGNOSIS — M6281 Muscle weakness (generalized): Secondary | ICD-10-CM

## 2024-07-05 DIAGNOSIS — R2689 Other abnormalities of gait and mobility: Secondary | ICD-10-CM

## 2024-07-05 DIAGNOSIS — M5459 Other low back pain: Secondary | ICD-10-CM

## 2024-07-12 ENCOUNTER — Ambulatory Visit: Admitting: Physical Therapy
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
## 2024-07-18 ENCOUNTER — Encounter: Payer: Self-pay | Admitting: Physical Therapy

## 2024-07-18 ENCOUNTER — Ambulatory Visit: Attending: Physician Assistant | Admitting: Physical Therapy

## 2024-07-18 DIAGNOSIS — R2689 Other abnormalities of gait and mobility: Secondary | ICD-10-CM | POA: Diagnosis present

## 2024-07-18 DIAGNOSIS — M6281 Muscle weakness (generalized): Secondary | ICD-10-CM | POA: Insufficient documentation

## 2024-07-18 DIAGNOSIS — M5459 Other low back pain: Secondary | ICD-10-CM | POA: Diagnosis present
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
## 2024-07-20 ENCOUNTER — Ambulatory Visit: Admitting: Physical Therapy

## 2024-07-20 ENCOUNTER — Encounter: Payer: Self-pay | Admitting: Physical Therapy

## 2024-07-20 ENCOUNTER — Ambulatory Visit: Admitting: Physician Assistant

## 2024-07-20 DIAGNOSIS — R2689 Other abnormalities of gait and mobility: Secondary | ICD-10-CM

## 2024-07-20 DIAGNOSIS — M5459 Other low back pain: Secondary | ICD-10-CM

## 2024-07-20 DIAGNOSIS — M6281 Muscle weakness (generalized): Secondary | ICD-10-CM
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
## 2024-07-25 ENCOUNTER — Encounter: Payer: Self-pay | Admitting: Physical Therapy

## 2024-07-25 ENCOUNTER — Ambulatory Visit: Admitting: Physical Therapy

## 2024-07-25 DIAGNOSIS — M5459 Other low back pain: Secondary | ICD-10-CM

## 2024-07-25 DIAGNOSIS — M6281 Muscle weakness (generalized): Secondary | ICD-10-CM

## 2024-07-25 DIAGNOSIS — R2689 Other abnormalities of gait and mobility: Secondary | ICD-10-CM
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
## 2024-07-27 ENCOUNTER — Ambulatory Visit: Admitting: Physical Therapy
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
## 2024-07-28 ENCOUNTER — Encounter: Payer: Self-pay | Admitting: Physical Therapy

## 2024-07-28 ENCOUNTER — Ambulatory Visit: Admitting: Physical Therapy

## 2024-07-28 DIAGNOSIS — M5459 Other low back pain: Secondary | ICD-10-CM

## 2024-07-28 DIAGNOSIS — R2689 Other abnormalities of gait and mobility: Secondary | ICD-10-CM

## 2024-07-28 DIAGNOSIS — M6281 Muscle weakness (generalized): Secondary | ICD-10-CM

## 2024-08-01 ENCOUNTER — Ambulatory Visit: Admitting: Physical Therapy
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
## 2024-08-02 ENCOUNTER — Ambulatory Visit (INDEPENDENT_AMBULATORY_CARE_PROVIDER_SITE_OTHER): Admitting: Physician Assistant

## 2024-08-02 VITALS — BP 110/73 | HR 77 | Ht 65.0 in | Wt 242.5 lb

## 2024-08-02 DIAGNOSIS — H60502 Unspecified acute noninfective otitis externa, left ear: Secondary | ICD-10-CM

## 2024-08-02 DIAGNOSIS — J0140 Acute pansinusitis, unspecified: Secondary | ICD-10-CM | POA: Diagnosis not present

## 2024-08-02 DIAGNOSIS — N938 Other specified abnormal uterine and vaginal bleeding: Secondary | ICD-10-CM | POA: Diagnosis not present

## 2024-08-02 DIAGNOSIS — Z975 Presence of (intrauterine) contraceptive device: Secondary | ICD-10-CM | POA: Diagnosis not present

## 2024-08-02 MED ORDER — DOXYCYCLINE HYCLATE 100 MG PO TABS: 100.0000 mg | ORAL_TABLET | Freq: Two times a day (BID) | ORAL | 0 refills | Status: AC

## 2024-08-02 NOTE — Progress Notes (Unsigned)
" ° °  Acute Office Visit  Subjective:     Patient ID: Carly Cooper, female    DOB: [birth year], 24 y.o.   MRN: [id number]  No chief complaint on file.   HPI Patient is in today for ***  ROS      Objective:    There were no vitals taken for this visit. {Vitals History (Optional):23777}  Physical Exam  No results found for any visits on 08/02/24.      Assessment & Plan:   Problem List Items Addressed This Visit   None   No orders of the defined types were placed in this encounter.   No follow-ups on file.  Raelee Rossmann, PA-C   "

## 2024-08-03 DIAGNOSIS — Z975 Presence of (intrauterine) contraceptive device: Secondary | ICD-10-CM | POA: Insufficient documentation

## 2024-08-03 DIAGNOSIS — N938 Other specified abnormal uterine and vaginal bleeding: Secondary | ICD-10-CM | POA: Insufficient documentation

## 2024-08-05 ENCOUNTER — Encounter: Payer: Self-pay | Admitting: Physician Assistant

## 2024-08-11 ENCOUNTER — Other Ambulatory Visit

## 2024-08-11 DIAGNOSIS — N938 Other specified abnormal uterine and vaginal bleeding: Secondary | ICD-10-CM

## 2024-08-11 DIAGNOSIS — Z975 Presence of (intrauterine) contraceptive device: Secondary | ICD-10-CM

## 2024-08-15 ENCOUNTER — Ambulatory Visit: Payer: Self-pay | Admitting: Physician Assistant

## 2024-08-15 NOTE — Progress Notes (Signed)
 Charnee,   No fibroids.IUD in place. No ovarian cysts.
